# Patient Record
Sex: Female | Born: 1985 | Race: Black or African American | Hispanic: No | Marital: Single | Smoking: Current every day smoker
Health system: Southern US, Community
[De-identification: ages and names within clinical notes are randomized; demographics above are authoritative.]

## PROBLEM LIST (undated history)

## (undated) DIAGNOSIS — Z789 Other specified health status: Secondary | ICD-10-CM

## (undated) HISTORY — PX: TONSILLECTOMY: SUR1361

## (undated) HISTORY — DX: Other specified health status: Z78.9

---

## 2004-05-05 ENCOUNTER — Emergency Department: Payer: Self-pay | Admitting: Emergency Medicine

## 2004-11-12 ENCOUNTER — Observation Stay: Payer: Self-pay | Admitting: Obstetrics and Gynecology

## 2004-11-15 ENCOUNTER — Observation Stay: Payer: Self-pay | Admitting: Obstetrics and Gynecology

## 2004-11-17 ENCOUNTER — Inpatient Hospital Stay: Payer: Self-pay | Admitting: Obstetrics and Gynecology

## 2012-05-11 ENCOUNTER — Encounter: Payer: Self-pay | Admitting: Obstetrics & Gynecology

## 2013-04-09 ENCOUNTER — Other Ambulatory Visit: Payer: Self-pay | Admitting: Obstetrics and Gynecology

## 2013-04-09 ENCOUNTER — Ambulatory Visit (INDEPENDENT_AMBULATORY_CARE_PROVIDER_SITE_OTHER): Payer: 59 | Admitting: *Deleted

## 2013-04-09 ENCOUNTER — Encounter: Payer: Self-pay | Admitting: *Deleted

## 2013-04-09 VITALS — BP 122/87 | Ht 64.0 in | Wt 264.0 lb

## 2013-04-09 DIAGNOSIS — Z3480 Encounter for supervision of other normal pregnancy, unspecified trimester: Secondary | ICD-10-CM

## 2013-04-09 LAB — POCT URINE PREGNANCY: Preg Test, Ur: POSITIVE

## 2013-04-09 MED ORDER — VINATE CARE 40-1 MG PO CHEW: 1.0000 | CHEWABLE_TABLET | Freq: Every day | ORAL | Status: DC

## 2013-04-09 NOTE — Progress Notes (Signed)
P-102 

## 2013-04-09 NOTE — Patient Instructions (Signed)
Pregnancy If you are planning on getting pregnant, it is a good idea to make a preconception appointment with your caregiver to discuss having a healthy lifestyle before getting pregnant. This includes diet, weight, exercise, taking prenatal vitamins (especially folic acid, which helps prevent brain and spinal cord defects), avoiding alcohol, smoking and illegal drugs, medical problems (diabetes, convulsions), family history of genetic problems, working conditions, and immunizations. It is better to have knowledge of these things and do something about them before getting pregnant. During your pregnancy, it is important to follow certain guidelines in order to have a healthy baby. It is very important to get good prenatal care and follow your caregiver's instructions. Prenatal care includes all the medical care you receive before your baby's birth. This helps to prevent problems during the pregnancy and childbirth. HOME CARE INSTRUCTIONS   Start your prenatal visits by the 12th week of pregnancy or earlier, if possible. At first, appointments are usually scheduled monthly. They become more frequent in the last 2 months before delivery. It is important that you keep your caregiver's appointments and follow your caregiver's instructions regarding medication use, exercise, and diet.  During pregnancy, you are providing food for you and your baby. Eat a regular, well-balanced diet. Choose foods such as meat, fish, milk and other dairy products, vegetables, fruits, whole-grain breads and cereals. Your caregiver will inform you of the ideal weight gain depending on your current height and weight. Drink lots of liquids. Try to drink 8 glasses of water a day.  Alcohol is associated with a number of birth defects including fetal alcohol syndrome. It is best to avoid alcohol completely. Smoking will cause low birth rate and prematurity. Use of alcohol and nicotine during your pregnancy also increases the chances that  your child will be chemically dependent later in their life and may contribute to SIDS (Sudden Infant Death Syndrome).  Do not use illegal drugs.  Only take prescription or over-the-counter medications that are recommended by your caregiver. Other medications can cause genetic and physical problems in the baby.  Morning sickness can often be helped by keeping soda crackers at the bedside. Eat a few before getting up in the morning.  A sexual relationship may be continued until near the end of pregnancy if there are no other problems such as early (premature) leaking of amniotic fluid from the membranes, vaginal bleeding, painful intercourse or belly (abdominal) pain.  Exercise regularly. Check with your caregiver if you are unsure of the safety of some of your exercises.  Do not use hot tubs, steam rooms or saunas. These increase the risk of fainting and hurting yourself and the baby. Swimming is OK for exercise. Get plenty of rest, including afternoon naps when possible, especially in the third trimester.  Avoid toxic odors and chemicals.  Do not wear high heels. They may cause you to lose your balance and fall.  Do not lift over 5 pounds. If you do lift anything, lift with your legs and thighs, not your back.  Avoid long trips, especially in the third trimester.  If you have to travel out of the city or state, take a copy of your medical records with you. SEEK IMMEDIATE MEDICAL CARE IF:   You develop an unexplained oral temperature above 102 F (38.9 C), or as your caregiver suggests.  You have leaking of fluid from the vagina. If leaking membranes are suspected, take your temperature and inform your caregiver of this when you call.  There is vaginal spotting   or bleeding. Notify your caregiver of the amount and how many pads are used.  You continue to feel sick to your stomach (nauseous) and have no relief from remedies suggested, or you throw up (vomit) blood or coffee ground like  materials.  You develop upper abdominal pain.  You have round ligament discomfort in the lower abdominal area. This still must be evaluated by your caregiver.  You feel contractions of the uterus.  You do not feel the baby move, or there is less movement than before.  You have painful urination.  You have abnormal vaginal discharge.  You have persistent diarrhea.  You get a severe headache.  You have problems with your vision.  You develop muscle weakness.  You feel dizzy and faint.  You develop shortness of breath.  You develop chest pain.  You have back pain that travels down to your leg and feet.  You feel irregular or a very fast heartbeat.  You develop excessive weight gain in a short period of time (5 pounds in 3 to 5 days).  You are involved in a domestic violence situation. Document Released: 01/25/2005 Document Revised: 07/27/2011 Document Reviewed: 07/19/2008 [facility] Patient Information 2014 [place], [place]. Vaginal Birth After Cesarean Delivery Vaginal birth after cesarean delivery (VBAC) is giving birth vaginally after previously delivering a baby by a cesarean. In the past, if a woman had a cesarean delivery, all births afterwards would be done by cesarean delivery. This is no longer true. It can be safe for the mother to try a vaginal delivery after having a cesarean delivery.  It is important to discuss VBAC with your health care provider early in the pregnancy so you can understand the risks, benefits, and options. It will give you time to decide what is best in your particular case. The final decision about whether to have a VBAC or repeat cesarean delivery should be between you and your health care provider. Any changes in your health or your baby's health during your pregnancy may make it necessary to change your initial decision about VBAC.  WOMEN WHO PLAN TO HAVE A VBAC SHOULD CHECK WITH THEIR HEALTH CARE PROVIDER TO BE SURE THAT:  The previous cesarean  delivery was done with a low transverse uterine cut (incision) (not a vertical classical incision).   The birth canal is big enough for the baby.   There were no other operations on the uterus.   An electronic fetal monitor (EFM) will be on at all times during labor.   An operating room will be available and ready in case an emergency cesarean delivery is needed.   A health care provider and surgical nursing staff will be available at all times during labor to be ready to do an emergency delivery cesarean if necessary.   An anesthesiologist will be present in case an emergency cesarean delivery is needed.   The nursery is prepared and has adequate personnel and necessary equipment available to care for the baby in case of an emergency cesarean delivery. BENEFITS OF VBAC  Shorter stay in the hospital.   Avoidance of risks associated with cesarean delivery, such as:  Surgical complications, such as opening of the incision or hernia in the incision.  Injury to other organs.  Fever. This can occur if an infection develops after surgery. It can also occur as a reaction to the medicine given to make you numb during the surgery.  Less blood loss and need for blood transfusions.  Lower risk of blood clots  and infection.  Shorter recovery.   Decreased risk for having to remove the uterus (hysterectomy).   Decreased risk for the placenta to completely or partially cover the opening of the uterus (placenta previa) with a future pregnancy.   Decrease risk in future labor and delivery. RISKS OF A VBAC  Tearing (rupture) of the uterus. This is occurs in less than 1% of VBACs. The risk of this happening is higher if:  Steps are taken to begin the labor process (induce labor) or stimulate or strengthen contractions (augment labor).   Medicine is used to soften (ripen) the cervix.  Having to remove the uterus (hysterectomy) if it ruptures. VBAC SHOULD NOT BE DONE IF:  The  previous cesarean delivery was done with a vertical (classical) or T-shaped incision or you do not know what kind of incision was made.   You had a ruptured uterus.   You have had certain types of surgery on your uterus, such as removal of uterine fibroids. Ask your health care provider about other types of surgeries that prevent you from having a VBAC.  You have certain medical or childbirth (obstetrical) problems.   There are problems with the baby.   You have had two previous cesarean deliveries and no vaginal deliveries. OTHER FACTS TO KNOW ABOUT VBAC:  It is safe to have an epidural anesthetic with VBAC.   It is safe to turn the baby from a breech position (attempt an external cephalic version).   It is safe to try a VBAC with twins.   VBAC may not be successful if your baby weights 8.8 lb (4 kg) or more. However, weight predictions are not always accurate and should not be used alone to decide if VBAC is right for you.  There is an increased failure rate if the time between the cesarean delivery and VBAC is less than 19 months.   Your health care provider may advise against a VBAC if you have preeclampsia (high blood pressure, protein in the urine, and swelling of face and extremities).   VBAC is often successful if you previously gave birth vaginally.   VBAC is often successful when the labor starts spontaneously before the due date.   Delivering a baby through a VBAC is similar to having a normal spontaneous vaginal delivery. Document Released: 07/18/2006 Document Revised: 11/15/2012 Document Reviewed: 08/24/2012 [facility] Patient Information 2014 [place], [place].

## 2013-04-10 LAB — HIV ANTIBODY (ROUTINE TESTING W REFLEX)
HIV 1/O/2 Abs-Index Value: <1 (ref <1.00)
HIV-1/HIV-2 Ab: NONREACTIVE

## 2013-04-10 LAB — GLUCOSE TOLERANCE, 1 HOUR: GLUCOSE, 1HR PP: 111 mg/dL (ref 65–199)

## 2013-04-10 LAB — PRENATAL PROFILE I(LABCORP): HEP B S AG: NEGATIVE

## 2013-04-10 LAB — SPECIMEN STATUS REPORT

## 2013-04-13 LAB — CYSTIC FIBROSIS GENE TEST

## 2013-04-13 LAB — SPECIMEN STATUS REPORT

## 2013-04-13 LAB — CULTURE, URINE COMPREHENSIVE: Colony Count: >=100000

## 2013-04-16 ENCOUNTER — Telehealth: Payer: Self-pay | Admitting: *Deleted

## 2013-04-16 ENCOUNTER — Other Ambulatory Visit: Payer: Self-pay | Admitting: Obstetrics and Gynecology

## 2013-04-16 MED ORDER — CEPHALEXIN 500 MG PO CAPS: 500.0000 mg | ORAL_CAPSULE | Freq: Four times a day (QID) | ORAL | Status: DC

## 2013-04-16 NOTE — Telephone Encounter (Signed)
Pt aware of positive UTI.  Will go pick up medication.

## 2013-04-16 NOTE — Telephone Encounter (Signed)
Message copied by Grayland OrmondHINTON, Thadius Smisek C on Mon Apr 16, 2013  2:26 PM ------      Message from: CONSTANT, PEGGY      Created: Mon Apr 16, 2013  1:49 PM       Please inform patient of positive UTI. Keflex provided            Thanks            Peggy ------

## 2013-04-18 ENCOUNTER — Telehealth: Payer: Self-pay | Admitting: *Deleted

## 2013-04-18 NOTE — Telephone Encounter (Signed)
Left message for patient to call the office back regarding test results and antibiotics that have been called in for her.

## 2013-04-18 NOTE — Telephone Encounter (Signed)
Message copied by Barbara CowerNOGUES, Kirtan Sada L on Wed Apr 18, 2013  2:42 PM ------      Message from: CONSTANT, PEGGY      Created: Mon Apr 16, 2013  1:49 PM       Please inform patient of positive UTI. Keflex provided            Thanks            Peggy ------

## 2013-04-23 ENCOUNTER — Encounter: Payer: Self-pay | Admitting: Obstetrics & Gynecology

## 2013-04-23 ENCOUNTER — Ambulatory Visit (INDEPENDENT_AMBULATORY_CARE_PROVIDER_SITE_OTHER): Payer: 59 | Admitting: Obstetrics & Gynecology

## 2013-04-23 VITALS — BP 126/87 | Wt 266.0 lb

## 2013-04-23 DIAGNOSIS — Z3490 Encounter for supervision of normal pregnancy, unspecified, unspecified trimester: Secondary | ICD-10-CM

## 2013-04-23 DIAGNOSIS — Z3480 Encounter for supervision of other normal pregnancy, unspecified trimester: Secondary | ICD-10-CM

## 2013-04-23 DIAGNOSIS — O34219 Maternal care for unspecified type scar from previous cesarean delivery: Secondary | ICD-10-CM | POA: Insufficient documentation

## 2013-04-23 DIAGNOSIS — Z98891 History of uterine scar from previous surgery: Secondary | ICD-10-CM | POA: Insufficient documentation

## 2013-04-23 DIAGNOSIS — Z9889 Other specified postprocedural states: Secondary | ICD-10-CM

## 2013-04-23 DIAGNOSIS — J45909 Unspecified asthma, uncomplicated: Secondary | ICD-10-CM

## 2013-04-23 NOTE — Progress Notes (Signed)
P-99.  Pt thinks she may have an infection.  ?yeast infection

## 2013-04-23 NOTE — Addendum Note (Signed)
Addended by: Barbara CowerNOGUES, Ihsan Nomura L on: 04/23/2013 03:21 PM   Modules accepted: Orders

## 2013-04-23 NOTE — Progress Notes (Signed)
   Subjective:    Leslie Navarro is a G2P1001 917w5d being seen today for her first obstetrical visit.  Her obstetrical history is significant for obesity and prior c/s for failure to dilate (unsure how far dilated she was.  Induction ws for post dates and lasted 2 days). Patient does intend to breast feed. Pregnancy history fully reviewed.  Patient reports no complaints.  Filed Vitals:   04/23/13 1326  BP: 126/87  Weight: 266 lb (120.657 kg)    HISTORY: OB History  Gravida Para Term Preterm AB SAB TAB Ectopic Multiple Living  2 1 1  0 0 0 0 0 0 1    # Outcome Date GA Lbr Len/2nd Weight Sex Delivery Anes PTL Lv  2 CUR           1 TRM 11/19/04 2432w0d  8 lb 7 oz (3.827 kg) M LTCS Spinal  Y     Comments: failure to progress     History reviewed. No pertinent past medical history. Past Surgical History  Procedure Laterality Date  . Tonsillectomy     Family History  Problem Relation Age of Onset  . Diabetes Maternal Grandfather   . Cancer Paternal Grandfather 3160    colon     Exam    Uterus:     Pelvic Exam:    Perineum: No Hemorrhoids   Vulva: normal   Vagina:  normal mucosa   pH: n/a   Cervix: difficult to visulaize, small amt of bleeding with pap   Adnexa: normal adnexa   Bony Pelvis: averaaage  System: Breast:  normal appearance, no masses or tenderness   Skin: normal coloration and turgor, no rashes    Neurologic: oriented, normal mood   Extremities: no erythema, induration, or nodules   HEENT sclera clear, anicteric, oropharynx clear, no lesions, neck supple with midline trachea and thyroid without masses   Mouth/Teeth mucous membranes moist, pharynx normal without lesions and dental hygiene good   Neck supple and no masses   Cardiovascular: regular rate and rhythm   Respiratory:  chest clear, no wheezing, crepitations, rhonchi, normal symmetric air entry   Abdomen: soft, non tender, obese   Urinary: urethral meatus normal      Assessment:    Pregnancy:  G2P1001 There are no active problems to display for this patient.       Plan:     Initial labs drawn. Prenatal vitamins. Problem list reviewed and updated. Genetic Screening discussed First Screen: pt unsure of testing at this point.  Pt knows cut off is at 13 weeks 6 days.  Ultrasound discussed; fetal survey: requested.  Follow up in 4 weeks. Bedside US shows FH.  Uterus is retroverted. His prior c/s--obtaining op note  LEGGETT,KELLY H. 04/23/2013

## 2013-04-30 LAB — PAP, THINPREP RFLX HPV WITH CT/GC: PAP Smear Comment: 0

## 2013-05-21 ENCOUNTER — Ambulatory Visit (INDEPENDENT_AMBULATORY_CARE_PROVIDER_SITE_OTHER): Payer: 59 | Admitting: Family Medicine

## 2013-05-21 VITALS — BP 123/92 | Wt 266.0 lb

## 2013-05-21 DIAGNOSIS — Z3480 Encounter for supervision of other normal pregnancy, unspecified trimester: Secondary | ICD-10-CM

## 2013-05-21 DIAGNOSIS — Z3490 Encounter for supervision of normal pregnancy, unspecified, unspecified trimester: Secondary | ICD-10-CM

## 2013-05-21 DIAGNOSIS — O34219 Maternal care for unspecified type scar from previous cesarean delivery: Secondary | ICD-10-CM

## 2013-05-21 MED ORDER — MYNATE 90 PLUS PO TBCR: 1.0000 | EXTENDED_RELEASE_TABLET | Freq: Every day | ORAL | Status: DC

## 2013-05-21 MED ORDER — DOXYLAMINE SUCCINATE (SLEEP) 25 MG PO TABS: 25.0000 mg | ORAL_TABLET | Freq: Three times a day (TID) | ORAL | Status: DC | PRN

## 2013-05-21 MED ORDER — B-6 100 MG PO TABS: 1.0000 | ORAL_TABLET | Freq: Three times a day (TID) | ORAL | Status: DC | PRN

## 2013-05-21 NOTE — Patient Instructions (Signed)
Pregnancy - First Trimester During sexual intercourse, millions of sperm go into the vagina. Only 1 sperm will penetrate and fertilize the female egg while it is in the Fallopian tube. One week later, the fertilized egg implants into the wall of the uterus. An embryo begins to develop into a baby. At 6 to 8 weeks, the eyes and face are formed and the heartbeat can be seen on ultrasound. At the end of 12 weeks (first trimester), all the baby's organs are formed. Now that you are pregnant, you will want to do everything you can to have a healthy baby. Two of the most important things are to get good prenatal care and follow your caregiver's instructions. Prenatal care is all the medical care you receive before the baby's birth. It is given to prevent, find, and treat problems during the pregnancy and childbirth. PRENATAL EXAMS  During prenatal visits, your weight, blood pressure, and urine are checked. This is done to make sure you are healthy and progressing normally during the pregnancy.  A pregnant woman should gain 25 to 35 pounds during the pregnancy. However, if you are overweight or underweight, your caregiver will advise you regarding your weight.  Your caregiver will ask and answer questions for you.  Blood work, cervical cultures, other necessary tests, and a Pap test are done during your prenatal exams. These tests are done to check on your health and the probable health of your baby. Tests are strongly recommended and done for HIV with your permission. This is the virus that causes AIDS. These tests are done because medicines can be given to help prevent your baby from being born with this infection should you have been infected without knowing it. Blood work is also used to find out your blood type, previous infections, and follow your blood levels (hemoglobin).  Low hemoglobin (anemia) is common during pregnancy. Iron and vitamins are given to help prevent this. Later in the pregnancy,  blood tests for diabetes will be done along with any other tests if any problems develop.  You may need other tests to make sure you and the baby are doing well. CHANGES DURING THE FIRST TRIMESTER  Your body goes through many changes during pregnancy. They vary from person to person. Talk to your caregiver about changes you notice and are concerned about. Changes can include:  Your menstrual period stops.  The egg and sperm carry the genes that determine what you look like. Genes from you and your partner are forming a baby. The female genes determine whether the baby is a boy or a girl.  Your body increases in girth and you may feel bloated.  Feeling sick to your stomach (nauseous) and throwing up (vomiting). If the vomiting is uncontrollable, call your caregiver.  Your breasts will begin to enlarge and become tender.  Your nipples may stick out more and become darker.  The need to urinate more. Painful urination may mean you have a bladder infection.  Tiring easily.  Loss of appetite.  Cravings for certain kinds of food.  At first, you may gain or lose a couple of pounds.  You may have changes in your emotions from day to day (excited to be pregnant or concerned something may go wrong with the pregnancy and baby).  You may have more vivid and strange dreams. HOME CARE INSTRUCTIONS   It is very important to avoid all smoking, alcohol and non-prescribed drugs during your pregnancy. These affect the formation and growth of the baby.   Avoid chemicals while pregnant to ensure the delivery of a healthy infant.  Start your prenatal visits by the 12th week of pregnancy. They are usually scheduled monthly at first, then more often in the last 2 months before delivery. Keep your caregiver's appointments. Follow your caregiver's instructions regarding medicine use, blood and lab tests, exercise, and diet.  During pregnancy, you are providing food for you and your baby. Eat regular,  well-balanced meals. Choose foods such as meat, fish, milk and other low fat dairy products, vegetables, fruits, and whole-grain breads and cereals. Your caregiver will tell you of the ideal weight gain.  You can help morning sickness by keeping soda crackers at the bedside. Eat a couple before arising in the morning. You may want to use the crackers without salt on them.  Eating 4 to 5 small meals rather than 3 large meals a day also may help the nausea and vomiting.  Drinking liquids between meals instead of during meals also seems to help nausea and vomiting.  A physical sexual relationship may be continued throughout pregnancy if there are no other problems. Problems may be early (premature) leaking of amniotic fluid from the membranes, vaginal bleeding, or belly (abdominal) pain.  Exercise regularly if there are no restrictions. Check with your caregiver or physical therapist if you are unsure of the safety of some of your exercises. Greater weight gain will occur in the last 2 trimesters of pregnancy. Exercising will help:  Control your weight.  Keep you in shape.  Prepare you for labor and delivery.  Help you lose your pregnancy weight after you deliver your baby.  Wear a good support or jogging bra for breast tenderness during pregnancy. This may help if worn during sleep too.  Ask when prenatal classes are available. Begin classes when they are offered.  Do not use hot tubs, steam rooms, or saunas.  Wear your seat belt when driving. This protects you and your baby if you are in an accident.  Avoid raw meat, uncooked cheese, cat litter boxes, and soil used by cats throughout the pregnancy. These carry germs that can cause birth defects in the baby.  The first trimester is a good time to visit your dentist for your dental health. Getting your teeth cleaned is okay. Use a softer toothbrush and brush gently during pregnancy.  Ask for help if you have financial, counseling, or  nutritional needs during pregnancy. Your caregiver will be able to offer counseling for these needs as well as refer you for other special needs.  Do not take any medicines or herbs unless told by your caregiver.  Inform your caregiver if there is any mental or physical domestic violence.  Make a list of emergency phone numbers of family, friends, hospital, and police and fire departments.  Write down your questions. Take them to your prenatal visit.  Do not douche.  Do not cross your legs.  If you have to stand for long periods of time, rotate you feet or take small steps in a circle.  You may have more vaginal secretions that may require a sanitary pad. Do not use tampons or scented sanitary pads. MEDICINES AND DRUG USE IN PREGNANCY  Take prenatal vitamins as directed. The vitamin should contain 1 milligram of folic acid. Keep all vitamins out of reach of children. Only a couple vitamins or tablets containing iron may be fatal to a baby or young child when ingested.  Avoid use of all medicines, including herbs, over-the-counter medicines, not   prescribed or suggested by your caregiver. Only take over-the-counter or prescription medicines for pain, discomfort, or fever as directed by your caregiver. Do not use aspirin, ibuprofen, or naproxen unless directed by your caregiver.  Let your caregiver also know about herbs you may be using.  Alcohol is related to a number of birth defects. This includes fetal alcohol syndrome. All alcohol, in any form, should be avoided completely. Smoking will cause low birth rate and premature babies.  Street or illegal drugs are very harmful to the baby. They are absolutely forbidden. A baby born to an addicted mother will be addicted at birth. The baby will go through the same withdrawal an adult does.  Let your caregiver know about any medicines that you have to take and for what reason you take them. SEEK MEDICAL CARE IF:  You have any concerns or  worries during your pregnancy. It is better to call with your questions if you feel they cannot wait, rather than worry about them. SEEK IMMEDIATE MEDICAL CARE IF:   An unexplained oral temperature above 102 F (38.9 C) develops, or as your caregiver suggests.  You have leaking of fluid from the vagina (birth canal). If leaking membranes are suspected, take your temperature and inform your caregiver of this when you call.  There is vaginal spotting or bleeding. Notify your caregiver of the amount and how many pads are used.  You develop a bad smelling vaginal discharge with a change in the color.  You continue to feel sick to your stomach (nauseated) and have no relief from remedies suggested. You vomit blood or coffee ground-like materials.  You lose more than 2 pounds of weight in 1 week.  You gain more than 2 pounds of weight in 1 week and you notice swelling of your face, hands, feet, or legs.  You gain 5 pounds or more in 1 week (even if you do not have swelling of your hands, face, legs, or feet).  You get exposed to German measles and have never had them.  You are exposed to fifth disease or chickenpox.  You develop belly (abdominal) pain. Round ligament discomfort is a common non-cancerous (benign) cause of abdominal pain in pregnancy. Your caregiver still must evaluate this.  You develop headache, fever, diarrhea, pain with urination, or shortness of breath.  You fall or are in a car accident or have any kind of trauma.  There is mental or physical violence in your home. Document Released: 01/19/2001 Document Revised: 10/20/2011 Document Reviewed: 07/23/2008 [employer] Patient Information 2014 [employer].  Breastfeeding Deciding to breastfeed is one of the best choices you can make for you and your baby. A change in hormones during pregnancy causes your breast tissue to grow and increases the number and size of your milk ducts. These hormones also allow proteins,  sugars, and fats from your blood supply to make breast milk in your milk-producing glands. Hormones prevent breast milk from being released before your baby is born as well as prompt milk flow after birth. Once breastfeeding has begun, thoughts of your baby, as well as his or her sucking or crying, can stimulate the release of milk from your milk-producing glands.  BENEFITS OF BREASTFEEDING For Your Baby  Your first milk (colostrum) helps your baby's digestive system function better.   There are antibodies in your milk that help your baby fight off infections.   Your baby has a lower incidence of asthma, allergies, and sudden infant death syndrome.   The nutrients   in breast milk are better for your baby than infant formulas and are designed uniquely for your baby's needs.   Breast milk improves your baby's brain development.   Your baby is less likely to develop other conditions, such as childhood obesity, asthma, or type 2 diabetes mellitus.  For You   Breastfeeding helps to create a very special bond between you and your baby.   Breastfeeding is convenient. Breast milk is always available at the correct temperature and costs nothing.   Breastfeeding helps to burn calories and helps you lose the weight gained during pregnancy.   Breastfeeding makes your uterus contract to its prepregnancy size faster and slows bleeding (lochia) after you give birth.   Breastfeeding helps to lower your risk of developing type 2 diabetes mellitus, osteoporosis, and breast or ovarian cancer later in life. SIGNS THAT YOUR BABY IS HUNGRY Early Signs of Hunger  Increased alertness or activity.  Stretching.  Movement of the head from side to side.  Movement of the head and opening of the mouth when the corner of the mouth or cheek is stroked (rooting).  Increased sucking sounds, smacking lips, cooing, sighing, or squeaking.  Hand-to-mouth movements.  Increased sucking of fingers or  hands. Late Signs of Hunger  Fussing.  Intermittent crying. Extreme Signs of Hunger Signs of extreme hunger will require calming and consoling before your baby will be able to breastfeed successfully. Do not wait for the following signs of extreme hunger to occur before you initiate breastfeeding:   Restlessness.  A loud, strong cry.   Screaming. BREASTFEEDING BASICS Breastfeeding Initiation  Find a comfortable place to sit or lie down, with your neck and back well supported.  Place a pillow or rolled up blanket under your baby to bring him or her to the level of your breast (if you are seated). Nursing pillows are specially designed to help support your arms and your baby while you breastfeed.  Make sure that your baby's abdomen is facing your abdomen.   Gently massage your breast. With your fingertips, massage from your chest wall toward your nipple in a circular motion. This encourages milk flow. You may need to continue this action during the feeding if your milk flows slowly.  Support your breast with 4 fingers underneath and your thumb above your nipple. Make sure your fingers are well away from your nipple and your baby's mouth.   Stroke your baby's lips gently with your finger or nipple.   When your baby's mouth is open wide enough, quickly bring your baby to your breast, placing your entire nipple and as much of the colored area around your nipple (areola) as possible into your baby's mouth.   More areola should be visible above your baby's upper lip than below the lower lip.   Your baby's tongue should be between his or her lower gum and your breast.   Ensure that your baby's mouth is correctly positioned around your nipple (latched). Your baby's lips should create a seal on your breast and be turned out (everted).  It is common for your baby to suck about 2 3 minutes in order to start the flow of breast milk. Latching Teaching your baby how to latch on to your  breast properly is very important. An improper latch can cause nipple pain and decreased milk supply for you and poor weight gain in your baby. Also, if your baby is not latched onto your nipple properly, he or she may swallow some air during   feeding. This can make your baby fussy. Burping your baby when you switch breasts during the feeding can help to get rid of the air. However, teaching your baby to latch on properly is still the best way to prevent fussiness from swallowing air while breastfeeding. Signs that your baby has successfully latched on to your nipple:    Silent tugging or silent sucking, without causing you pain.   Swallowing heard between every 3 4 sucks.    Muscle movement above and in front of his or her ears while sucking.  Signs that your baby has not successfully latched on to nipple:   Sucking sounds or smacking sounds from your baby while breastfeeding.  Nipple pain. If you think your baby has not latched on correctly, slip your finger into the corner of your baby's mouth to break the suction and place it between your baby's gums. Attempt breastfeeding initiation again. Signs of Successful Breastfeeding Signs from your baby:   A gradual decrease in the number of sucks or complete cessation of sucking.   Falling asleep.   Relaxation of his or her body.   Retention of a small amount of milk in his or her mouth.   Letting go of your breast by himself or herself. Signs from you:  Breasts that have increased in firmness, weight, and size 1 3 hours after feeding.   Breasts that are softer immediately after breastfeeding.  Increased milk volume, as well as a change in milk consistency and color by the 5th day of breastfeeding.   Nipples that are not sore, cracked, or bleeding. Signs That Your Baby is Getting Enough Milk  Wetting at least 3 diapers in a 24-hour period. The urine should be clear and pale yellow by age 5 days.  At least 3 stools in a  24-hour period by age 5 days. The stool should be soft and yellow.  At least 3 stools in a 24-hour period by age 7 days. The stool should be seedy and yellow.  No loss of weight greater than 10% of birth weight during the first 3 days of age.  Average weight gain of 4 7 ounces (120 210 mL) per week after age 4 days.  Consistent daily weight gain by age 5 days, without weight loss after the age of 2 weeks. After a feeding, your baby may spit up a small amount. This is common. BREASTFEEDING FREQUENCY AND DURATION Frequent feeding will help you make more milk and can prevent sore nipples and breast engorgement. Breastfeed when you feel the need to reduce the fullness of your breasts or when your baby shows signs of hunger. This is called "breastfeeding on demand." Avoid introducing a pacifier to your baby while you are working to establish breastfeeding (the first 4 6 weeks after your baby is born). After this time you may choose to use a pacifier. Research has shown that pacifier use during the first year of a baby's life decreases the risk of sudden infant death syndrome (SIDS). Allow your baby to feed on each breast as long as he or she wants. Breastfeed until your baby is finished feeding. When your baby unlatches or falls asleep while feeding from the first breast, offer the second breast. Because newborns are often sleepy in the first few weeks of life, you may need to awaken your baby to get him or her to feed. Breastfeeding times will vary from baby to baby. However, the following rules can serve as a guide to help you   ensure that your baby is properly fed:  Newborns (babies 4 weeks of age or younger) may breastfeed every 1 3 hours.  Newborns should not go longer than 3 hours during the day or 5 hours during the night without breastfeeding.  You should breastfeed your baby a minimum of 8 times in a 24-hour period until you begin to introduce solid foods to your baby at around 6 months of  age. BREAST MILK PUMPING Pumping and storing breast milk allows you to ensure that your baby is exclusively fed your breast milk, even at times when you are unable to breastfeed. This is especially important if you are going back to work while you are still breastfeeding or when you are not able to be present during feedings. Your lactation consultant can give you guidelines on how long it is safe to store breast milk.  A breast pump is a machine that allows you to pump milk from your breast into a sterile bottle. The pumped breast milk can then be stored in a refrigerator or freezer. Some breast pumps are operated by hand, while others use electricity. Ask your lactation consultant which type will work best for you. Breast pumps can be purchased, but some hospitals and breastfeeding support groups lease breast pumps on a monthly basis. A lactation consultant can teach you how to hand express breast milk, if you prefer not to use a pump.  CARING FOR YOUR BREASTS WHILE YOU BREASTFEED Nipples can become dry, cracked, and sore while breastfeeding. The following recommendations can help keep your breasts moisturized and healthy:  Avoid using soap on your nipples.   Wear a supportive bra. Although not required, special nursing bras and tank tops are designed to allow access to your breasts for breastfeeding without taking off your entire bra or top. Avoid wearing underwire style bras or extremely tight bras.  Air dry your nipples for 3 4minutes after each feeding.   Use only cotton bra pads to absorb leaked breast milk. Leaking of breast milk between feedings is normal.   Use lanolin on your nipples after breastfeeding. Lanolin helps to maintain your skin's normal moisture barrier. If you use pure lanolin you do not need to wash it off before feeding your baby again. Pure lanolin is not toxic to your baby. You may also hand express a few drops of breast milk and gently massage that milk into your  nipples and allow the milk to air dry. In the first few weeks after giving birth, some women experience extremely full breasts (engorgement). Engorgement can make your breasts feel heavy, warm, and tender to the touch. Engorgement peaks within 3 5 days after you give birth. The following recommendations can help ease engorgement:  Completely empty your breasts while breastfeeding or pumping. You may want to start by applying warm, moist heat (in the shower or with warm water-soaked hand towels) just before feeding or pumping. This increases circulation and helps the milk flow. If your baby does not completely empty your breasts while breastfeeding, pump any extra milk after he or she is finished.  Wear a snug bra (nursing or regular) or tank top for 1 2 days to signal your body to slightly decrease milk production.  Apply ice packs to your breasts, unless this is too uncomfortable for you.  Make sure that your baby is latched on and positioned properly while breastfeeding. If engorgement persists after 48 hours of following these recommendations, contact your health care provider or a lactation consultant. OVERALL   HEALTH CARE RECOMMENDATIONS WHILE BREASTFEEDING  Eat healthy foods. Alternate between meals and snacks, eating 3 of each per day. Because what you eat affects your breast milk, some of the foods may make your baby more irritable than usual. Avoid eating these foods if you are sure that they are negatively affecting your baby.  Drink milk, fruit juice, and water to satisfy your thirst (about 10 glasses a day).   Rest often, relax, and continue to take your prenatal vitamins to prevent fatigue, stress, and anemia.  Continue breast self-awareness checks.  Avoid chewing and smoking tobacco.  Avoid alcohol and drug use. Some medicines that may be harmful to your baby can pass through breast milk. It is important to ask your health care provider before taking any medicine, including all  over-the-counter and prescription medicine as well as vitamin and herbal supplements. It is possible to become pregnant while breastfeeding. If birth control is desired, ask your health care provider about options that will be safe for your baby. SEEK MEDICAL CARE IF:   You feel like you want to stop breastfeeding or have become frustrated with breastfeeding.  You have painful breasts or nipples.  Your nipples are cracked or bleeding.  Your breasts are red, tender, or warm.  You have a swollen area on either breast.  You have a fever or chills.  You have nausea or vomiting.  You have drainage other than breast milk from your nipples.  Your breasts do not become full before feedings by the 5th day after you give birth.  You feel sad and depressed.  Your baby is too sleepy to eat well.  Your baby is having trouble sleeping.   Your baby is wetting less than 3 diapers in a 24-hour period.  Your baby has less than 3 stools in a 24-hour period.  Your baby's skin or the white part of his or her eyes becomes yellow.   Your baby is not gaining weight by 5 days of age. SEEK IMMEDIATE MEDICAL CARE IF:   Your baby is overly tired (lethargic) and does not want to wake up and feed.  Your baby develops an unexplained fever. Document Released: 01/25/2005 Document Revised: 09/27/2012 Document Reviewed: 07/19/2012 [employer] Patient Information 2014 [employer].  

## 2013-05-21 NOTE — Progress Notes (Signed)
Having morning sickness--trial of B6 and Unisom Declines first trimester screening or any genetic testing.

## 2013-05-21 NOTE — Progress Notes (Signed)
[id number] 

## 2013-06-18 ENCOUNTER — Encounter: Payer: Self-pay | Admitting: Obstetrics & Gynecology

## 2013-06-18 ENCOUNTER — Ambulatory Visit (INDEPENDENT_AMBULATORY_CARE_PROVIDER_SITE_OTHER): Payer: 59 | Admitting: Obstetrics & Gynecology

## 2013-06-18 VITALS — BP 119/83 | HR 100 | Wt 270.0 lb

## 2013-06-18 DIAGNOSIS — K5900 Constipation, unspecified: Secondary | ICD-10-CM

## 2013-06-18 DIAGNOSIS — Z3490 Encounter for supervision of normal pregnancy, unspecified, unspecified trimester: Secondary | ICD-10-CM

## 2013-06-18 DIAGNOSIS — Z3480 Encounter for supervision of other normal pregnancy, unspecified trimester: Secondary | ICD-10-CM

## 2013-06-18 DIAGNOSIS — O34219 Maternal care for unspecified type scar from previous cesarean delivery: Secondary | ICD-10-CM

## 2013-06-18 NOTE — Progress Notes (Signed)
Pt is having constipation issues.  Is not taking anything for the constipation.

## 2013-06-18 NOTE — Progress Notes (Signed)
Routine visit. No problems except constipation but she is not drinking much water per her (works at [employer] Declines Quad screen. Schedule anatomy u/s for 19 + weeks EGA. Rec Miralax and increase water.

## 2013-07-09 ENCOUNTER — Ambulatory Visit (HOSPITAL_COMMUNITY)
Admission: RE | Admit: 2013-07-09 | Discharge: 2013-07-09 | Disposition: A | Discharge status: Home / Self Care | Payer: 59 | Source: Ambulatory Visit | Attending: Obstetrics & Gynecology | Admitting: Obstetrics & Gynecology

## 2013-07-09 DIAGNOSIS — Z3689 Encounter for other specified antenatal screening: Secondary | ICD-10-CM | POA: Insufficient documentation

## 2013-07-09 DIAGNOSIS — Z3490 Encounter for supervision of normal pregnancy, unspecified, unspecified trimester: Secondary | ICD-10-CM

## 2013-07-16 ENCOUNTER — Encounter: Payer: Self-pay | Admitting: Family Medicine

## 2013-07-16 ENCOUNTER — Ambulatory Visit (INDEPENDENT_AMBULATORY_CARE_PROVIDER_SITE_OTHER): Payer: 59 | Admitting: Family Medicine

## 2013-07-16 VITALS — BP 115/83 | HR 104 | Wt 274.0 lb

## 2013-07-16 DIAGNOSIS — Z3480 Encounter for supervision of other normal pregnancy, unspecified trimester: Secondary | ICD-10-CM

## 2013-07-16 DIAGNOSIS — Z3490 Encounter for supervision of normal pregnancy, unspecified, unspecified trimester: Secondary | ICD-10-CM

## 2013-07-16 NOTE — Patient Instructions (Signed)
Second Trimester of Pregnancy The second trimester is from week 13 through week 28, months 4 through 6. The second trimester is often a time when you feel your best. Your body has also adjusted to being pregnant, and you begin to feel better physically. Usually, morning sickness has lessened or quit completely, you may have more energy, and you may have an increase in appetite. The second trimester is also a time when the fetus is growing rapidly. At the end of the sixth month, the fetus is about 9 inches long and weighs about 1 pounds. You will likely begin to feel the baby move (quickening) between 18 and 20 weeks of the pregnancy. BODY CHANGES Your body goes through many changes during pregnancy. The changes vary from woman to woman.   Your weight will continue to increase. You will notice your lower abdomen bulging out.  You may begin to get stretch marks on your hips, abdomen, and breasts.  You may develop headaches that can be relieved by medicines approved by your caregiver.  You may urinate more often because the fetus is pressing on your bladder.  You may develop or continue to have heartburn as a result of your pregnancy.  You may develop constipation because certain hormones are causing the muscles that push waste through your intestines to slow down.  You may develop hemorrhoids or swollen, bulging veins (varicose veins).  You may have back pain because of the weight gain and pregnancy hormones relaxing your joints between the bones in your pelvis and as a result of a shift in weight and the muscles that support your balance.  Your breasts will continue to grow and be tender.  Your gums may bleed and may be sensitive to brushing and flossing.  Dark spots or blotches (chloasma, mask of pregnancy) may develop on your face. This will likely fade after the baby is born.  A dark line from your belly button to the pubic area (linea nigra) may appear. This will likely fade after  the baby is born. WHAT TO EXPECT AT YOUR PRENATAL VISITS During a routine prenatal visit:  You will be weighed to make sure you and the fetus are growing normally.  Your blood pressure will be taken.  Your abdomen will be measured to track your baby's growth.  The fetal heartbeat will be listened to.  Any test results from the previous visit will be discussed. Your caregiver may ask you:  How you are feeling.  If you are feeling the baby move.  If you have had any abnormal symptoms, such as leaking fluid, bleeding, severe headaches, or abdominal cramping.  If you have any questions. Other tests that may be performed during your second trimester include:  Blood tests that check for:  Low iron levels (anemia).  Gestational diabetes (between 24 and 28 weeks).  Rh antibodies.  Urine tests to check for infections, diabetes, or protein in the urine.  An ultrasound to confirm the proper growth and development of the baby.  An amniocentesis to check for possible genetic problems.  Fetal screens for spina bifida and Down syndrome. HOME CARE INSTRUCTIONS   Avoid all smoking, herbs, alcohol, and unprescribed drugs. These chemicals affect the formation and growth of the baby.  Follow your caregiver's instructions regarding medicine use. There are medicines that are either safe or unsafe to take during pregnancy.  Exercise only as directed by your caregiver. Experiencing uterine cramps is a good sign to stop exercising.  Continue to eat regular,   healthy meals.  Wear a good support bra for breast tenderness.  Do not use hot tubs, steam rooms, or saunas.  Wear your seat belt at all times when driving.  Avoid raw meat, uncooked cheese, cat litter boxes, and soil used by cats. These carry germs that can cause birth defects in the baby.  Take your prenatal vitamins.  Try taking a stool softener (if your caregiver approves) if you develop constipation. Eat more high-fiber  foods, such as fresh vegetables or fruit and whole grains. Drink plenty of fluids to keep your urine clear or pale yellow.  Take warm sitz baths to soothe any pain or discomfort caused by hemorrhoids. Use hemorrhoid cream if your caregiver approves.  If you develop varicose veins, wear support hose. Elevate your feet for 15 minutes, 3 4 times a day. Limit salt in your diet.  Avoid heavy lifting, wear low heel shoes, and practice good posture.  Rest with your legs elevated if you have leg cramps or low back pain.  Visit your dentist if you have not gone yet during your pregnancy. Use a soft toothbrush to brush your teeth and be gentle when you floss.  A sexual relationship may be continued unless your caregiver directs you otherwise.  Continue to go to all your prenatal visits as directed by your caregiver. SEEK MEDICAL CARE IF:   You have dizziness.  You have mild pelvic cramps, pelvic pressure, or nagging pain in the abdominal area.  You have persistent nausea, vomiting, or diarrhea.  You have a bad smelling vaginal discharge.  You have pain with urination. SEEK IMMEDIATE MEDICAL CARE IF:   You have a fever.  You are leaking fluid from your vagina.  You have spotting or bleeding from your vagina.  You have severe abdominal cramping or pain.  You have rapid weight gain or loss.  You have shortness of breath with chest pain.  You notice sudden or extreme swelling of your face, hands, ankles, feet, or legs.  You have not felt your baby move in over an hour.  You have severe headaches that do not go away with medicine.  You have vision changes. Document Released: 01/19/2001 Document Revised: 09/27/2012 Document Reviewed: 03/28/2012 [employer] Patient Information 2014 [employer].  Breastfeeding Deciding to breastfeed is one of the best choices you can make for you and your baby. A change in hormones during pregnancy causes your breast tissue to grow and increases the  number and size of your milk ducts. These hormones also allow proteins, sugars, and fats from your blood supply to make breast milk in your milk-producing glands. Hormones prevent breast milk from being released before your baby is born as well as prompt milk flow after birth. Once breastfeeding has begun, thoughts of your baby, as well as his or her sucking or crying, can stimulate the release of milk from your milk-producing glands.  BENEFITS OF BREASTFEEDING For Your Baby  Your first milk (colostrum) helps your baby's digestive system function better.   There are antibodies in your milk that help your baby fight off infections.   Your baby has a lower incidence of asthma, allergies, and sudden infant death syndrome.   The nutrients in breast milk are better for your baby than infant formulas and are designed uniquely for your baby's needs.   Breast milk improves your baby's brain development.   Your baby is less likely to develop other conditions, such as childhood obesity, asthma, or type 2 diabetes mellitus.  For   You   Breastfeeding helps to create a very special bond between you and your baby.   Breastfeeding is convenient. Breast milk is always available at the correct temperature and costs nothing.   Breastfeeding helps to burn calories and helps you lose the weight gained during pregnancy.   Breastfeeding makes your uterus contract to its prepregnancy size faster and slows bleeding (lochia) after you give birth.   Breastfeeding helps to lower your risk of developing type 2 diabetes mellitus, osteoporosis, and breast or ovarian cancer later in life. SIGNS THAT YOUR BABY IS HUNGRY Early Signs of Hunger  Increased alertness or activity.  Stretching.  Movement of the head from side to side.  Movement of the head and opening of the mouth when the corner of the mouth or cheek is stroked (rooting).  Increased sucking sounds, smacking lips, cooing, sighing, or  squeaking.  Hand-to-mouth movements.  Increased sucking of fingers or hands. Late Signs of Hunger  Fussing.  Intermittent crying. Extreme Signs of Hunger Signs of extreme hunger will require calming and consoling before your baby will be able to breastfeed successfully. Do not wait for the following signs of extreme hunger to occur before you initiate breastfeeding:   Restlessness.  A loud, strong cry.   Screaming. BREASTFEEDING BASICS Breastfeeding Initiation  Find a comfortable place to sit or lie down, with your neck and back well supported.  Place a pillow or rolled up blanket under your baby to bring him or her to the level of your breast (if you are seated). Nursing pillows are specially designed to help support your arms and your baby while you breastfeed.  Make sure that your baby's abdomen is facing your abdomen.   Gently massage your breast. With your fingertips, massage from your chest wall toward your nipple in a circular motion. This encourages milk flow. You may need to continue this action during the feeding if your milk flows slowly.  Support your breast with 4 fingers underneath and your thumb above your nipple. Make sure your fingers are well away from your nipple and your baby's mouth.   Stroke your baby's lips gently with your finger or nipple.   When your baby's mouth is open wide enough, quickly bring your baby to your breast, placing your entire nipple and as much of the colored area around your nipple (areola) as possible into your baby's mouth.   More areola should be visible above your baby's upper lip than below the lower lip.   Your baby's tongue should be between his or her lower gum and your breast.   Ensure that your baby's mouth is correctly positioned around your nipple (latched). Your baby's lips should create a seal on your breast and be turned out (everted).  It is common for your baby to suck about 2 3 minutes in order to start the  flow of breast milk. Latching Teaching your baby how to latch on to your breast properly is very important. An improper latch can cause nipple pain and decreased milk supply for you and poor weight gain in your baby. Also, if your baby is not latched onto your nipple properly, he or she may swallow some air during feeding. This can make your baby fussy. Burping your baby when you switch breasts during the feeding can help to get rid of the air. However, teaching your baby to latch on properly is still the best way to prevent fussiness from swallowing air while breastfeeding. Signs that your baby has   successfully latched on to your nipple:    Silent tugging or silent sucking, without causing you pain.   Swallowing heard between every 3 4 sucks.    Muscle movement above and in front of his or her ears while sucking.  Signs that your baby has not successfully latched on to nipple:   Sucking sounds or smacking sounds from your baby while breastfeeding.  Nipple pain. If you think your baby has not latched on correctly, slip your finger into the corner of your baby's mouth to break the suction and place it between your baby's gums. Attempt breastfeeding initiation again. Signs of Successful Breastfeeding Signs from your baby:   A gradual decrease in the number of sucks or complete cessation of sucking.   Falling asleep.   Relaxation of his or her body.   Retention of a small amount of milk in his or her mouth.   Letting go of your breast by himself or herself. Signs from you:  Breasts that have increased in firmness, weight, and size 1 3 hours after feeding.   Breasts that are softer immediately after breastfeeding.  Increased milk volume, as well as a change in milk consistency and color by the 5th day of breastfeeding.   Nipples that are not sore, cracked, or bleeding. Signs That Your Baby is Getting Enough Milk  Wetting at least 3 diapers in a 24-hour period. The urine  should be clear and pale yellow by age 5 days.  At least 3 stools in a 24-hour period by age 5 days. The stool should be soft and yellow.  At least 3 stools in a 24-hour period by age 7 days. The stool should be seedy and yellow.  No loss of weight greater than 10% of birth weight during the first 3 days of age.  Average weight gain of 4 7 ounces (120 210 mL) per week after age 4 days.  Consistent daily weight gain by age 5 days, without weight loss after the age of 2 weeks. After a feeding, your baby may spit up a small amount. This is common. BREASTFEEDING FREQUENCY AND DURATION Frequent feeding will help you make more milk and can prevent sore nipples and breast engorgement. Breastfeed when you feel the need to reduce the fullness of your breasts or when your baby shows signs of hunger. This is called "breastfeeding on demand." Avoid introducing a pacifier to your baby while you are working to establish breastfeeding (the first 4 6 weeks after your baby is born). After this time you may choose to use a pacifier. Research has shown that pacifier use during the first year of a baby's life decreases the risk of sudden infant death syndrome (SIDS). Allow your baby to feed on each breast as long as he or she wants. Breastfeed until your baby is finished feeding. When your baby unlatches or falls asleep while feeding from the first breast, offer the second breast. Because newborns are often sleepy in the first few weeks of life, you may need to awaken your baby to get him or her to feed. Breastfeeding times will vary from baby to baby. However, the following rules can serve as a guide to help you ensure that your baby is properly fed:  Newborns (babies 4 weeks of age or younger) may breastfeed every 1 3 hours.  Newborns should not go longer than 3 hours during the day or 5 hours during the night without breastfeeding.  You should breastfeed your baby a minimum of   8 times in a 24-hour period until  you begin to introduce solid foods to your baby at around 6 months of age. BREAST MILK PUMPING Pumping and storing breast milk allows you to ensure that your baby is exclusively fed your breast milk, even at times when you are unable to breastfeed. This is especially important if you are going back to work while you are still breastfeeding or when you are not able to be present during feedings. Your lactation consultant can give you guidelines on how long it is safe to store breast milk.  A breast pump is a machine that allows you to pump milk from your breast into a sterile bottle. The pumped breast milk can then be stored in a refrigerator or freezer. Some breast pumps are operated by hand, while others use electricity. Ask your lactation consultant which type will work best for you. Breast pumps can be purchased, but some hospitals and breastfeeding support groups lease breast pumps on a monthly basis. A lactation consultant can teach you how to hand express breast milk, if you prefer not to use a pump.  CARING FOR YOUR BREASTS WHILE YOU BREASTFEED Nipples can become dry, cracked, and sore while breastfeeding. The following recommendations can help keep your breasts moisturized and healthy:  Avoid using soap on your nipples.   Wear a supportive bra. Although not required, special nursing bras and tank tops are designed to allow access to your breasts for breastfeeding without taking off your entire bra or top. Avoid wearing underwire style bras or extremely tight bras.  Air dry your nipples for 3 4minutes after each feeding.   Use only cotton bra pads to absorb leaked breast milk. Leaking of breast milk between feedings is normal.   Use lanolin on your nipples after breastfeeding. Lanolin helps to maintain your skin's normal moisture barrier. If you use pure lanolin you do not need to wash it off before feeding your baby again. Pure lanolin is not toxic to your baby. You may also hand express a  few drops of breast milk and gently massage that milk into your nipples and allow the milk to air dry. In the first few weeks after giving birth, some women experience extremely full breasts (engorgement). Engorgement can make your breasts feel heavy, warm, and tender to the touch. Engorgement peaks within 3 5 days after you give birth. The following recommendations can help ease engorgement:  Completely empty your breasts while breastfeeding or pumping. You may want to start by applying warm, moist heat (in the shower or with warm water-soaked hand towels) just before feeding or pumping. This increases circulation and helps the milk flow. If your baby does not completely empty your breasts while breastfeeding, pump any extra milk after he or she is finished.  Wear a snug bra (nursing or regular) or tank top for 1 2 days to signal your body to slightly decrease milk production.  Apply ice packs to your breasts, unless this is too uncomfortable for you.  Make sure that your baby is latched on and positioned properly while breastfeeding. If engorgement persists after 48 hours of following these recommendations, contact your health care provider or a lactation consultant. OVERALL HEALTH CARE RECOMMENDATIONS WHILE BREASTFEEDING  Eat healthy foods. Alternate between meals and snacks, eating 3 of each per day. Because what you eat affects your breast milk, some of the foods may make your baby more irritable than usual. Avoid eating these foods if you are sure that they are   negatively affecting your baby.  Drink milk, fruit juice, and water to satisfy your thirst (about 10 glasses a day).   Rest often, relax, and continue to take your prenatal vitamins to prevent fatigue, stress, and anemia.  Continue breast self-awareness checks.  Avoid chewing and smoking tobacco.  Avoid alcohol and drug use. Some medicines that may be harmful to your baby can pass through breast milk. It is important to ask your  health care provider before taking any medicine, including all over-the-counter and prescription medicine as well as vitamin and herbal supplements. It is possible to become pregnant while breastfeeding. If birth control is desired, ask your health care provider about options that will be safe for your baby. SEEK MEDICAL CARE IF:   You feel like you want to stop breastfeeding or have become frustrated with breastfeeding.  You have painful breasts or nipples.  Your nipples are cracked or bleeding.  Your breasts are red, tender, or warm.  You have a swollen area on either breast.  You have a fever or chills.  You have nausea or vomiting.  You have drainage other than breast milk from your nipples.  Your breasts do not become full before feedings by the 5th day after you give birth.  You feel sad and depressed.  Your baby is too sleepy to eat well.  Your baby is having trouble sleeping.   Your baby is wetting less than 3 diapers in a 24-hour period.  Your baby has less than 3 stools in a 24-hour period.  Your baby's skin or the white part of his or her eyes becomes yellow.   Your baby is not gaining weight by 5 days of age. SEEK IMMEDIATE MEDICAL CARE IF:   Your baby is overly tired (lethargic) and does not want to wake up and feed.  Your baby develops an unexplained fever. Document Released: 01/25/2005 Document Revised: 09/27/2012 Document Reviewed: 07/19/2012 [employer] Patient Information 2014 [employer].  

## 2013-07-16 NOTE — Progress Notes (Signed)
Reviewed anatomy US--needs f/u in 6 wks--scheduled

## 2013-07-30 ENCOUNTER — Encounter (HOSPITAL_COMMUNITY): Payer: Self-pay

## 2013-08-13 ENCOUNTER — Ambulatory Visit (INDEPENDENT_AMBULATORY_CARE_PROVIDER_SITE_OTHER): Payer: 59 | Admitting: Obstetrics & Gynecology

## 2013-08-13 VITALS — BP 122/94 | HR 107 | Wt 279.0 lb

## 2013-08-13 DIAGNOSIS — Z3492 Encounter for supervision of normal pregnancy, unspecified, second trimester: Secondary | ICD-10-CM

## 2013-08-13 DIAGNOSIS — Z3480 Encounter for supervision of other normal pregnancy, unspecified trimester: Secondary | ICD-10-CM

## 2013-08-13 NOTE — Patient Instructions (Addendum)
Return to clinic for any obstetric concerns or go to [facility] for evaluation Tetanus, Diphtheria, Pertussis (Tdap) Vaccine What You Need to Know WHY GET VACCINATED? Tetanus, diphtheria and pertussis can be very serious diseases, even for adolescents and adults. Tdap vaccine can protect us from these diseases. TETANUS (Lockjaw) causes painful muscle tightening and stiffness, usually all over the body.  It can lead to tightening of muscles in the head and neck so you can't open your mouth, swallow, or sometimes even breathe. Tetanus kills about 1 out of 5 people who are infected. DIPHTHERIA can cause a thick coating to form in the back of the throat.  It can lead to breathing problems, paralysis, heart failure, and death. PERTUSSIS (Whooping Cough) causes severe coughing spells, which can cause difficulty breathing, vomiting and disturbed sleep.  It can also lead to weight loss, incontinence, and rib fractures. Up to 2 in 100 adolescents and 5 in 100 adults with pertussis are hospitalized or have complications, which could include pneumonia and death. These diseases are caused by bacteria. Diphtheria and pertussis are spread from person to person through coughing or sneezing. Tetanus enters the body through cuts, scratches, or wounds. Before vaccines, the [place] saw as many as 200,000 cases a year of diphtheria and pertussis, and hundreds of cases of tetanus. Since vaccination began, tetanus and diphtheria have dropped by about 99% and pertussis by about 80%. TDAP VACCINE Tdap vaccine can protect adolescents and adults from tetanus, diphtheria, and pertussis. One dose of Tdap is routinely given at age 11 or 12. People who did not get Tdap at that age should get it as soon as possible. Tdap is especially important for health care professionals and anyone having close contact with a baby younger than 12 months. Pregnant women should get a dose of Tdap during every pregnancy, to protect the newborn  from pertussis. Infants are most at risk for severe, life-threatening complications from pertussis. A similar vaccine, called Td, protects from tetanus and diphtheria, but not pertussis. A Td booster should be given every 10 years. Tdap may be given as one of these boosters if you have not already gotten a dose. Tdap may also be given after a severe cut or burn to prevent tetanus infection. Your doctor can give you more information. Tdap may safely be given at the same time as other vaccines. SOME PEOPLE SHOULD NOT GET THIS VACCINE  If you ever had a life-threatening allergic reaction after a dose of any tetanus, diphtheria, or pertussis containing vaccine, OR if you have a severe allergy to any part of this vaccine, you should not get Tdap. Tell your doctor if you have any severe allergies.  If you had a coma, or long or multiple seizures within 7 days after a childhood dose of DTP or DTaP, you should not get Tdap, unless a cause other than the vaccine was found. You can still get Td.  Talk to your doctor if you:  have epilepsy or another nervous system problem,  had severe pain or swelling after any vaccine containing diphtheria, tetanus or pertussis,  ever had Guillain-Barr Syndrome (GBS),  aren't feeling well on the day the shot is scheduled. RISKS OF A VACCINE REACTION With any medicine, including vaccines, there is a chance of side effects. These are usually mild and go away on their own, but serious reactions are also possible. Brief fainting spells can follow a vaccination, leading to injuries from falling. Sitting or lying down for about 15 minutes can   help prevent these. Tell your doctor if you feel dizzy or light-headed, or have vision changes or ringing in the ears. Mild problems following Tdap (Did not interfere with activities)  Pain where the shot was given (about 3 in 4 adolescents or 2 in 3 adults)  Redness or swelling where the shot was given (about 1 person in 5)  Mild  fever of at least 100.4F (up to about 1 in 25 adolescents or 1 in 100 adults)  Headache (about 3 or 4 people in 10)  Tiredness (about 1 person in 3 or 4)  Nausea, vomiting, diarrhea, stomach ache (up to 1 in 4 adolescents or 1 in 10 adults)  Chills, body aches, sore joints, rash, swollen glands (uncommon) Moderate problems following Tdap (Interfered with activities, but did not require medical attention)  Pain where the shot was given (about 1 in 5 adolescents or 1 in 100 adults)  Redness or swelling where the shot was given (up to about 1 in 16 adolescents or 1 in 25 adults)  Fever over 102F (about 1 in 100 adolescents or 1 in 250 adults)  Headache (about 3 in 20 adolescents or 1 in 10 adults)  Nausea, vomiting, diarrhea, stomach ache (up to 1 or 3 people in 100)  Swelling of the entire arm where the shot was given (up to about 3 in 100). Severe problems following Tdap (Unable to perform usual activities, required medical attention)  Swelling, severe pain, bleeding and redness in the arm where the shot was given (rare). A severe allergic reaction could occur after any vaccine (estimated less than 1 in a million doses). WHAT IF THERE IS A SERIOUS REACTION? What should I look for?  Look for anything that concerns you, such as signs of a severe allergic reaction, very high fever, or behavior changes. Signs of a severe allergic reaction can include hives, swelling of the face and throat, difficulty breathing, a fast heartbeat, dizziness, and weakness. These would start a few minutes to a few hours after the vaccination. What should I do?  If you think it is a severe allergic reaction or other emergency that can't wait, call 9-1-1 or get the person to the nearest hospital. Otherwise, call your doctor.  Afterward, the reaction should be reported to the "Vaccine Adverse Event Reporting System" (VAERS). Your doctor might file this report, or you can do it yourself through the VAERS web  site at www.vaers.hhs.gov, or by calling 1-800-822-7967. VAERS is only for reporting reactions. They do not give medical advice.  THE NATIONAL VACCINE INJURY COMPENSATION PROGRAM The National Vaccine Injury Compensation Program (VICP) is a federal program that was created to compensate people who may have been injured by certain vaccines. Persons who believe they may have been injured by a vaccine can learn about the program and about filing a claim by calling 1-800-338-2382 or visiting the VICP website at www.hrsa.gov/vaccinecompensation. HOW CAN I LEARN MORE?  Ask your doctor.  Call your local or state health department.  Contact the Centers for Disease Control and Prevention (CDC):  Call 1-800-232-4636 or visit CDC's website at www.cdc.gov/vaccines. CDC Tdap Vaccine VIS (06/17/11) Document Released: 07/27/2011 Document Revised: 05/22/2012 Document Reviewed: 05/17/2012 [employer] Patient Information 2015 [employer]. This information is not intended to replace advice given to you by your health care provider. Make sure you discuss any questions you have with your health care provider.  

## 2013-08-13 NOTE — Progress Notes (Signed)
SBP 94, no other signs/symptoms of preeclampsia. Will continue to monitor. TOLAC and BTL papers signed today; patient only wants wants BTL in the event of a RCS. No other complaints or concerns.  Fetal movement and labor precautions reviewed

## 2013-08-21 ENCOUNTER — Encounter (HOSPITAL_COMMUNITY): Payer: Self-pay

## 2013-08-21 ENCOUNTER — Ambulatory Visit (HOSPITAL_COMMUNITY)
Admission: RE | Admit: 2013-08-21 | Discharge: 2013-08-21 | Disposition: A | Discharge status: Home / Self Care | Payer: 59 | Source: Ambulatory Visit | Attending: Family Medicine | Admitting: Family Medicine

## 2013-08-21 DIAGNOSIS — Z3689 Encounter for other specified antenatal screening: Secondary | ICD-10-CM | POA: Insufficient documentation

## 2013-08-21 DIAGNOSIS — Z3490 Encounter for supervision of normal pregnancy, unspecified, unspecified trimester: Secondary | ICD-10-CM

## 2013-08-22 ENCOUNTER — Encounter: Payer: Self-pay | Admitting: Family Medicine

## 2013-09-10 ENCOUNTER — Ambulatory Visit (INDEPENDENT_AMBULATORY_CARE_PROVIDER_SITE_OTHER): Payer: 59 | Admitting: Family Medicine

## 2013-09-10 VITALS — BP 121/81 | HR 103 | Wt 280.0 lb

## 2013-09-10 DIAGNOSIS — Z23 Encounter for immunization: Secondary | ICD-10-CM

## 2013-09-10 DIAGNOSIS — Z3480 Encounter for supervision of other normal pregnancy, unspecified trimester: Secondary | ICD-10-CM

## 2013-09-10 DIAGNOSIS — Z3493 Encounter for supervision of normal pregnancy, unspecified, third trimester: Secondary | ICD-10-CM

## 2013-09-10 LAB — OB RESULTS CONSOLE HIV ANTIBODY (ROUTINE TESTING): HIV: NONREACTIVE

## 2013-09-10 LAB — OB RESULTS CONSOLE RPR: RPR: NONREACTIVE

## 2013-09-10 MED ORDER — TETANUS-DIPHTH-ACELL PERTUSSIS 5-2.5-18.5 LF-MCG/0.5 IM SUSP
0.5000 mL | Freq: Once | INTRAMUSCULAR | Status: AC
  Administered 2013-09-10: 0.5 mL via INTRAMUSCULAR

## 2013-09-10 NOTE — Progress Notes (Signed)
Glucose test today

## 2013-09-10 NOTE — Patient Instructions (Signed)
Third Trimester of Pregnancy The third trimester is from week 29 through week 42, months 7 through 9. The third trimester is a time when the fetus is growing rapidly. At the end of the ninth month, the fetus is about 20 inches in length and weighs 6-10 pounds.  BODY CHANGES Your body goes through many changes during pregnancy. The changes vary from woman to woman.   Your weight will continue to increase. You can expect to gain 25-35 pounds (11-16 kg) by the end of the pregnancy.  You may begin to get stretch marks on your hips, abdomen, and breasts.  You may urinate more often because the fetus is moving lower into your pelvis and pressing on your bladder.  You may develop or continue to have heartburn as a result of your pregnancy.  You may develop constipation because certain hormones are causing the muscles that push waste through your intestines to slow down.  You may develop hemorrhoids or swollen, bulging veins (varicose veins).  You may have pelvic pain because of the weight gain and pregnancy hormones relaxing your joints between the bones in your pelvis. Backaches may result from overexertion of the muscles supporting your posture.  You may have changes in your hair. These can include thickening of your hair, rapid growth, and changes in texture. Some women also have hair loss during or after pregnancy, or hair that feels dry or thin. Your hair will most likely return to normal after your baby is born.  Your breasts will continue to grow and be tender. A yellow discharge may leak from your breasts called colostrum.  Your belly button may stick out.  You may feel short of breath because of your expanding uterus.  You may notice the fetus "dropping," or moving lower in your abdomen.  You may have a bloody mucus discharge. This usually occurs a few days to a week before labor begins.  Your cervix becomes thin and soft (effaced) near your due date. WHAT TO EXPECT AT YOUR  PRENATAL EXAMS  You will have prenatal exams every 2 weeks until week 36. Then, you will have weekly prenatal exams. During a routine prenatal visit:  You will be weighed to make sure you and the fetus are growing normally.  Your blood pressure is taken.  Your abdomen will be measured to track your baby's growth.  The fetal heartbeat will be listened to.  Any test results from the previous visit will be discussed.  You may have a cervical check near your due date to see if you have effaced. At around 36 weeks, your caregiver will check your cervix. At the same time, your caregiver will also perform a test on the secretions of the vaginal tissue. This test is to determine if a type of bacteria, Group B streptococcus, is present. Your caregiver will explain this further. Your caregiver may ask you:  What your birth plan is.  How you are feeling.  If you are feeling the baby move.  If you have had any abnormal symptoms, such as leaking fluid, bleeding, severe headaches, or abdominal cramping.  If you have any questions. Other tests or screenings that may be performed during your third trimester include:  Blood tests that check for low iron levels (anemia).  Fetal testing to check the health, activity level, and growth of the fetus. Testing is done if you have certain medical conditions or if there are problems during the pregnancy. FALSE LABOR You may feel small, irregular contractions that   eventually go away. These are called Braxton Hicks contractions, or false labor. Contractions may last for hours, days, or even weeks before true labor sets in. If contractions come at regular intervals, intensify, or become painful, it is best to be seen by your caregiver.  SIGNS OF LABOR   Menstrual-like cramps.  Contractions that are 5 minutes apart or less.  Contractions that start on the top of the uterus and spread down to the lower abdomen and back.  A sense of increased pelvic  pressure or back pain.  A watery or bloody mucus discharge that comes from the vagina. If you have any of these signs before the 37th week of pregnancy, call your caregiver right away. You need to go to the hospital to get checked immediately. HOME CARE INSTRUCTIONS   Avoid all smoking, herbs, alcohol, and unprescribed drugs. These chemicals affect the formation and growth of the baby.  Follow your caregiver's instructions regarding medicine use. There are medicines that are either safe or unsafe to take during pregnancy.  Exercise only as directed by your caregiver. Experiencing uterine cramps is a good sign to stop exercising.  Continue to eat regular, healthy meals.  Wear a good support bra for breast tenderness.  Do not use hot tubs, steam rooms, or saunas.  Wear your seat belt at all times when driving.  Avoid raw meat, uncooked cheese, cat litter boxes, and soil used by cats. These carry germs that can cause birth defects in the baby.  Take your prenatal vitamins.  Try taking a stool softener (if your caregiver approves) if you develop constipation. Eat more high-fiber foods, such as fresh vegetables or fruit and whole grains. Drink plenty of fluids to keep your urine clear or pale yellow.  Take warm sitz baths to soothe any pain or discomfort caused by hemorrhoids. Use hemorrhoid cream if your caregiver approves.  If you develop varicose veins, wear support hose. Elevate your feet for 15 minutes, 3-4 times a day. Limit salt in your diet.  Avoid heavy lifting, wear low heal shoes, and practice good posture.  Rest a lot with your legs elevated if you have leg cramps or low back pain.  Visit your dentist if you have not gone during your pregnancy. Use a soft toothbrush to brush your teeth and be gentle when you floss.  A sexual relationship may be continued unless your caregiver directs you otherwise.  Do not travel far distances unless it is absolutely necessary and only  with the approval of your caregiver.  Take prenatal classes to understand, practice, and ask questions about the labor and delivery.  Make a trial run to the hospital.  Pack your hospital bag.  Prepare the baby's nursery.  Continue to go to all your prenatal visits as directed by your caregiver. SEEK MEDICAL CARE IF:  You are unsure if you are in labor or if your water has broken.  You have dizziness.  You have mild pelvic cramps, pelvic pressure, or nagging pain in your abdominal area.  You have persistent nausea, vomiting, or diarrhea.  You have a bad smelling vaginal discharge.  You have pain with urination. SEEK IMMEDIATE MEDICAL CARE IF:   You have a fever.  You are leaking fluid from your vagina.  You have spotting or bleeding from your vagina.  You have severe abdominal cramping or pain.  You have rapid weight loss or gain.  You have shortness of breath with chest pain.  You notice sudden or extreme swelling   of your face, hands, ankles, feet, or legs.  You have not felt your baby move in over an hour.  You have severe headaches that do not go away with medicine.  You have vision changes. Document Released: 01/19/2001 Document Revised: 01/30/2013 Document Reviewed: 03/28/2012 [employer] Patient Information 2015 [employer]. This information is not intended to replace advice given to you by your health care provider. Make sure you discuss any questions you have with your health care provider.  Breastfeeding Deciding to breastfeed is one of the best choices you can make for you and your baby. A change in hormones during pregnancy causes your breast tissue to grow and increases the number and size of your milk ducts. These hormones also allow proteins, sugars, and fats from your blood supply to make breast milk in your milk-producing glands. Hormones prevent breast milk from being released before your baby is born as well as prompt milk flow after birth. Once  breastfeeding has begun, thoughts of your baby, as well as his or her sucking or crying, can stimulate the release of milk from your milk-producing glands.  BENEFITS OF BREASTFEEDING For Your Baby  Your first milk (colostrum) helps your baby's digestive system function better.   There are antibodies in your milk that help your baby fight off infections.   Your baby has a lower incidence of asthma, allergies, and sudden infant death syndrome.   The nutrients in breast milk are better for your baby than infant formulas and are designed uniquely for your baby's needs.   Breast milk improves your baby's brain development.   Your baby is less likely to develop other conditions, such as childhood obesity, asthma, or type 2 diabetes mellitus.  For You   Breastfeeding helps to create a very special bond between you and your baby.   Breastfeeding is convenient. Breast milk is always available at the correct temperature and costs nothing.   Breastfeeding helps to burn calories and helps you lose the weight gained during pregnancy.   Breastfeeding makes your uterus contract to its prepregnancy size faster and slows bleeding (lochia) after you give birth.   Breastfeeding helps to lower your risk of developing type 2 diabetes mellitus, osteoporosis, and breast or ovarian cancer later in life. SIGNS THAT YOUR BABY IS HUNGRY Early Signs of Hunger  Increased alertness or activity.  Stretching.  Movement of the head from side to side.  Movement of the head and opening of the mouth when the corner of the mouth or cheek is stroked (rooting).  Increased sucking sounds, smacking lips, cooing, sighing, or squeaking.  Hand-to-mouth movements.  Increased sucking of fingers or hands. Late Signs of Hunger  Fussing.  Intermittent crying. Extreme Signs of Hunger Signs of extreme hunger will require calming and consoling before your baby will be able to breastfeed successfully. Do not  wait for the following signs of extreme hunger to occur before you initiate breastfeeding:   Restlessness.  A loud, strong cry.   Screaming. BREASTFEEDING BASICS Breastfeeding Initiation  Find a comfortable place to sit or lie down, with your neck and back well supported.  Place a pillow or rolled up blanket under your baby to bring him or her to the level of your breast (if you are seated). Nursing pillows are specially designed to help support your arms and your baby while you breastfeed.  Make sure that your baby's abdomen is facing your abdomen.   Gently massage your breast. With your fingertips, massage from your chest   wall toward your nipple in a circular motion. This encourages milk flow. You may need to continue this action during the feeding if your milk flows slowly.  Support your breast with 4 fingers underneath and your thumb above your nipple. Make sure your fingers are well away from your nipple and your baby's mouth.   Stroke your baby's lips gently with your finger or nipple.   When your baby's mouth is open wide enough, quickly bring your baby to your breast, placing your entire nipple and as much of the colored area around your nipple (areola) as possible into your baby's mouth.   More areola should be visible above your baby's upper lip than below the lower lip.   Your baby's tongue should be between his or her lower gum and your breast.   Ensure that your baby's mouth is correctly positioned around your nipple (latched). Your baby's lips should create a seal on your breast and be turned out (everted).  It is common for your baby to suck about 2-3 minutes in order to start the flow of breast milk. Latching Teaching your baby how to latch on to your breast properly is very important. An improper latch can cause nipple pain and decreased milk supply for you and poor weight gain in your baby. Also, if your baby is not latched onto your nipple properly, he or she  may swallow some air during feeding. This can make your baby fussy. Burping your baby when you switch breasts during the feeding can help to get rid of the air. However, teaching your baby to latch on properly is still the best way to prevent fussiness from swallowing air while breastfeeding. Signs that your baby has successfully latched on to your nipple:    Silent tugging or silent sucking, without causing you pain.   Swallowing heard between every 3-4 sucks.    Muscle movement above and in front of his or her ears while sucking.  Signs that your baby has not successfully latched on to nipple:   Sucking sounds or smacking sounds from your baby while breastfeeding.  Nipple pain. If you think your baby has not latched on correctly, slip your finger into the corner of your baby's mouth to break the suction and place it between your baby's gums. Attempt breastfeeding initiation again. Signs of Successful Breastfeeding Signs from your baby:   A gradual decrease in the number of sucks or complete cessation of sucking.   Falling asleep.   Relaxation of his or her body.   Retention of a small amount of milk in his or her mouth.   Letting go of your breast by himself or herself. Signs from you:  Breasts that have increased in firmness, weight, and size 1-3 hours after feeding.   Breasts that are softer immediately after breastfeeding.  Increased milk volume, as well as a change in milk consistency and color by the fifth day of breastfeeding.   Nipples that are not sore, cracked, or bleeding. Signs That Your Baby is Getting Enough Milk  Wetting at least 3 diapers in a 24-hour period. The urine should be clear and pale yellow by age 5 days.  At least 3 stools in a 24-hour period by age 5 days. The stool should be soft and yellow.  At least 3 stools in a 24-hour period by age 7 days. The stool should be seedy and yellow.  No loss of weight greater than 10% of birth weight  during the first 3   days of age.  Average weight gain of 4-7 ounces (113-198 g) per week after age 4 days.  Consistent daily weight gain by age 5 days, without weight loss after the age of 2 weeks. After a feeding, your baby may spit up a small amount. This is common. BREASTFEEDING FREQUENCY AND DURATION Frequent feeding will help you make more milk and can prevent sore nipples and breast engorgement. Breastfeed when you feel the need to reduce the fullness of your breasts or when your baby shows signs of hunger. This is called "breastfeeding on demand." Avoid introducing a pacifier to your baby while you are working to establish breastfeeding (the first 4-6 weeks after your baby is born). After this time you may choose to use a pacifier. Research has shown that pacifier use during the first year of a baby's life decreases the risk of sudden infant death syndrome (SIDS). Allow your baby to feed on each breast as long as he or she wants. Breastfeed until your baby is finished feeding. When your baby unlatches or falls asleep while feeding from the first breast, offer the second breast. Because newborns are often sleepy in the first few weeks of life, you may need to awaken your baby to get him or her to feed. Breastfeeding times will vary from baby to baby. However, the following rules can serve as a guide to help you ensure that your baby is properly fed:  Newborns (babies 4 weeks of age or younger) may breastfeed every 1-3 hours.  Newborns should not go longer than 3 hours during the day or 5 hours during the night without breastfeeding.  You should breastfeed your baby a minimum of 8 times in a 24-hour period until you begin to introduce solid foods to your baby at around 6 months of age. BREAST MILK PUMPING Pumping and storing breast milk allows you to ensure that your baby is exclusively fed your breast milk, even at times when you are unable to breastfeed. This is especially important if you are  going back to work while you are still breastfeeding or when you are not able to be present during feedings. Your lactation consultant can give you guidelines on how long it is safe to store breast milk.  A breast pump is a machine that allows you to pump milk from your breast into a sterile bottle. The pumped breast milk can then be stored in a refrigerator or freezer. Some breast pumps are operated by hand, while others use electricity. Ask your lactation consultant which type will work best for you. Breast pumps can be purchased, but some hospitals and breastfeeding support groups lease breast pumps on a monthly basis. A lactation consultant can teach you how to hand express breast milk, if you prefer not to use a pump.  CARING FOR YOUR BREASTS WHILE YOU BREASTFEED Nipples can become dry, cracked, and sore while breastfeeding. The following recommendations can help keep your breasts moisturized and healthy:  Avoid using soap on your nipples.   Wear a supportive bra. Although not required, special nursing bras and tank tops are designed to allow access to your breasts for breastfeeding without taking off your entire bra or top. Avoid wearing underwire-style bras or extremely tight bras.  Air dry your nipples for 3-4minutes after each feeding.   Use only cotton bra pads to absorb leaked breast milk. Leaking of breast milk between feedings is normal.   Use lanolin on your nipples after breastfeeding. Lanolin helps to maintain your skin's   normal moisture barrier. If you use pure lanolin, you do not need to wash it off before feeding your baby again. Pure lanolin is not toxic to your baby. You may also hand express a few drops of breast milk and gently massage that milk into your nipples and allow the milk to air dry. In the first few weeks after giving birth, some women experience extremely full breasts (engorgement). Engorgement can make your breasts feel heavy, warm, and tender to the touch.  Engorgement peaks within 3-5 days after you give birth. The following recommendations can help ease engorgement:  Completely empty your breasts while breastfeeding or pumping. You may want to start by applying warm, moist heat (in the shower or with warm water-soaked hand towels) just before feeding or pumping. This increases circulation and helps the milk flow. If your baby does not completely empty your breasts while breastfeeding, pump any extra milk after he or she is finished.  Wear a snug bra (nursing or regular) or tank top for 1-2 days to signal your body to slightly decrease milk production.  Apply ice packs to your breasts, unless this is too uncomfortable for you.  Make sure that your baby is latched on and positioned properly while breastfeeding. If engorgement persists after 48 hours of following these recommendations, contact your health care provider or a lactation consultant. OVERALL HEALTH CARE RECOMMENDATIONS WHILE BREASTFEEDING  Eat healthy foods. Alternate between meals and snacks, eating 3 of each per day. Because what you eat affects your breast milk, some of the foods may make your baby more irritable than usual. Avoid eating these foods if you are sure that they are negatively affecting your baby.  Drink milk, fruit juice, and water to satisfy your thirst (about 10 glasses a day).   Rest often, relax, and continue to take your prenatal vitamins to prevent fatigue, stress, and anemia.  Continue breast self-awareness checks.  Avoid chewing and smoking tobacco.  Avoid alcohol and drug use. Some medicines that may be harmful to your baby can pass through breast milk. It is important to ask your health care provider before taking any medicine, including all over-the-counter and prescription medicine as well as vitamin and herbal supplements. It is possible to become pregnant while breastfeeding. If birth control is desired, ask your health care provider about options that  will be safe for your baby. SEEK MEDICAL CARE IF:   You feel like you want to stop breastfeeding or have become frustrated with breastfeeding.  You have painful breasts or nipples.  Your nipples are cracked or bleeding.  Your breasts are red, tender, or warm.  You have a swollen area on either breast.  You have a fever or chills.  You have nausea or vomiting.  You have drainage other than breast milk from your nipples.  Your breasts do not become full before feedings by the fifth day after you give birth.  You feel sad and depressed.  Your baby is too sleepy to eat well.  Your baby is having trouble sleeping.   Your baby is wetting less than 3 diapers in a 24-hour period.  Your baby has less than 3 stools in a 24-hour period.  Your baby's skin or the white part of his or her eyes becomes yellow.   Your baby is not gaining weight by 5 days of age. SEEK IMMEDIATE MEDICAL CARE IF:   Your baby is overly tired (lethargic) and does not want to wake up and feed.  Your baby   develops an unexplained fever. Document Released: 01/25/2005 Document Revised: 01/30/2013 Document Reviewed: 07/19/2012 [employer] Patient Information 2015 [employer]. This information is not intended to replace advice given to you by your health care provider. Make sure you discuss any questions you have with your health care provider.  

## 2013-09-10 NOTE — Progress Notes (Signed)
28 wk labs and TDaP today BP ok today

## 2013-09-11 LAB — CBC
HEMATOCRIT: 32.1 % — AB (ref 34.0–46.6)
Hemoglobin: 10.6 g/dL — ABNORMAL LOW (ref 11.1–15.9)
MCH: 23.8 pg — ABNORMAL LOW (ref 26.6–33.0)
MCHC: 33 g/dL (ref 31.5–35.7)
MCV: 72 fL — AB (ref 79–97)
PLATELETS: 163 10*3/uL (ref 150–379)
RBC: 4.45 x10E6/uL (ref 3.77–5.28)
RDW: 14.5 % (ref 12.3–15.4)
WBC: 8.8 10*3/uL (ref 3.4–10.8)

## 2013-09-11 LAB — RPR: SYPHILIS RPR SCR: NONREACTIVE

## 2013-09-11 LAB — HIV ANTIBODY (ROUTINE TESTING W REFLEX)
HIV 1/HIV 2 AB: NONREACTIVE
HIV 1/O/2 Abs-Index Value: <1 (ref <1.00)

## 2013-09-11 LAB — GLUCOSE, 1 HOUR GESTATIONAL: Gestational Diabetes Screen: 141 mg/dL — ABNORMAL HIGH (ref 65–139)

## 2013-09-12 ENCOUNTER — Observation Stay: Payer: Self-pay | Admitting: Emergency Medicine

## 2013-09-17 ENCOUNTER — Other Ambulatory Visit: Payer: 59

## 2013-09-24 ENCOUNTER — Other Ambulatory Visit (INDEPENDENT_AMBULATORY_CARE_PROVIDER_SITE_OTHER): Payer: 59 | Admitting: *Deleted

## 2013-09-24 DIAGNOSIS — R7309 Other abnormal glucose: Secondary | ICD-10-CM

## 2013-09-24 DIAGNOSIS — O99810 Abnormal glucose complicating pregnancy: Secondary | ICD-10-CM

## 2013-09-25 LAB — GLUCOSE TOLERANCE (3 SP BLOOD)
GLUCOSE FASTING GTT: 68 mg/dL (ref 65–99)
GLUCOSE, 2 HOUR: 124 mg/dL (ref 65–139)
GLUCOSE, 3 HOUR: 73 mg/dL (ref 65–109)
Glucose, 1 Hour GTT: 165 mg/dL (ref 65–199)

## 2013-09-26 ENCOUNTER — Encounter: Payer: Self-pay | Admitting: Family Medicine

## 2013-10-01 ENCOUNTER — Ambulatory Visit (INDEPENDENT_AMBULATORY_CARE_PROVIDER_SITE_OTHER): Payer: 59 | Admitting: Obstetrics & Gynecology

## 2013-10-01 VITALS — BP 103/81 | HR 99 | Wt 283.0 lb

## 2013-10-01 DIAGNOSIS — Z0000 Encounter for general adult medical examination without abnormal findings: Secondary | ICD-10-CM

## 2013-10-01 DIAGNOSIS — Z3480 Encounter for supervision of other normal pregnancy, unspecified trimester: Secondary | ICD-10-CM

## 2013-10-01 DIAGNOSIS — Z3493 Encounter for supervision of normal pregnancy, unspecified, third trimester: Secondary | ICD-10-CM

## 2013-10-01 DIAGNOSIS — O34219 Maternal care for unspecified type scar from previous cesarean delivery: Secondary | ICD-10-CM

## 2013-10-01 NOTE — Progress Notes (Signed)
Normal 3 hour GTT. No other complaints or concerns.  Labor and fetal movement precautions reviewed. Work labs checked today as per patient request.

## 2013-10-03 LAB — HEMOGLOBIN A1C
Est. average glucose Bld gHb Est-mCnc: 120 mg/dL
Hgb A1c MFr Bld: 5.8 % — ABNORMAL HIGH (ref 4.8–5.6)

## 2013-10-03 LAB — LIPID PANEL
CHOLESTEROL TOTAL: 208 mg/dL — AB (ref 100–199)
Chol/HDL Ratio: 3.5 ratio units (ref 0.0–4.4)
HDL: 60 mg/dL (ref >39)
LDL Calculated: 128 mg/dL — ABNORMAL HIGH (ref 0–99)
Triglycerides: 100 mg/dL (ref 0–149)
VLDL Cholesterol Cal: 20 mg/dL (ref 5–40)

## 2013-10-04 LAB — GLUCOSE, RANDOM: GLUCOSE, PLASMA: 81 mg/dL (ref 65–99)

## 2013-10-09 LAB — NICOTINE/COTININE METABOLITES
COTININE: NOT DETECTED ng/mL
Nicotine: NOT DETECTED ng/mL

## 2013-10-12 ENCOUNTER — Other Ambulatory Visit: Payer: Self-pay | Admitting: Obstetrics & Gynecology

## 2013-10-12 DIAGNOSIS — O34219 Maternal care for unspecified type scar from previous cesarean delivery: Secondary | ICD-10-CM

## 2013-10-16 ENCOUNTER — Ambulatory Visit (HOSPITAL_COMMUNITY)
Admission: RE | Admit: 2013-10-16 | Discharge: 2013-10-16 | Disposition: A | Discharge status: Home / Self Care | Payer: 59 | Source: Ambulatory Visit | Attending: Obstetrics & Gynecology | Admitting: Obstetrics & Gynecology

## 2013-10-16 ENCOUNTER — Encounter: Payer: 59 | Admitting: Obstetrics & Gynecology

## 2013-10-16 VITALS — BP 135/67 | Wt 285.8 lb

## 2013-10-16 DIAGNOSIS — O99210 Obesity complicating pregnancy, unspecified trimester: Secondary | ICD-10-CM | POA: Diagnosis not present

## 2013-10-16 DIAGNOSIS — Z3689 Encounter for other specified antenatal screening: Secondary | ICD-10-CM | POA: Insufficient documentation

## 2013-10-16 DIAGNOSIS — Z3493 Encounter for supervision of normal pregnancy, unspecified, third trimester: Secondary | ICD-10-CM

## 2013-10-16 DIAGNOSIS — E669 Obesity, unspecified: Secondary | ICD-10-CM | POA: Insufficient documentation

## 2013-10-16 DIAGNOSIS — O34219 Maternal care for unspecified type scar from previous cesarean delivery: Secondary | ICD-10-CM | POA: Diagnosis present

## 2013-10-19 ENCOUNTER — Ambulatory Visit (INDEPENDENT_AMBULATORY_CARE_PROVIDER_SITE_OTHER): Payer: 59 | Admitting: Obstetrics & Gynecology

## 2013-10-19 ENCOUNTER — Encounter: Payer: Self-pay | Admitting: Obstetrics & Gynecology

## 2013-10-19 VITALS — BP 118/82 | HR 112 | Wt 284.8 lb

## 2013-10-19 DIAGNOSIS — Z3493 Encounter for supervision of normal pregnancy, unspecified, third trimester: Secondary | ICD-10-CM

## 2013-10-19 DIAGNOSIS — O34219 Maternal care for unspecified type scar from previous cesarean delivery: Secondary | ICD-10-CM

## 2013-10-19 DIAGNOSIS — Z23 Encounter for immunization: Secondary | ICD-10-CM

## 2013-10-19 DIAGNOSIS — Z3480 Encounter for supervision of other normal pregnancy, unspecified trimester: Secondary | ICD-10-CM

## 2013-10-19 NOTE — Patient Instructions (Signed)
Return to clinic for any obstetric concerns or go to [facility] for evaluation  

## 2013-10-19 NOTE — Progress Notes (Signed)
10/16/13 EFW 5 lbs 1 oz 77%, AC 895, normal AFV.  Scheduled for rescan in early 11/2013. No other complaints or concerns.  Flu vaccine given today.  Labor and fetal movement precautions reviewed.

## 2013-11-05 ENCOUNTER — Encounter: Payer: 59 | Admitting: Obstetrics & Gynecology

## 2013-11-12 ENCOUNTER — Encounter: Payer: Self-pay | Admitting: Family Medicine

## 2013-11-12 ENCOUNTER — Ambulatory Visit (INDEPENDENT_AMBULATORY_CARE_PROVIDER_SITE_OTHER): Payer: 59 | Admitting: Family Medicine

## 2013-11-12 VITALS — BP 123/85 | HR 106 | Wt 286.0 lb

## 2013-11-12 DIAGNOSIS — Z3493 Encounter for supervision of normal pregnancy, unspecified, third trimester: Secondary | ICD-10-CM

## 2013-11-12 DIAGNOSIS — Z36 Encounter for antenatal screening of mother: Secondary | ICD-10-CM

## 2013-11-12 LAB — OB RESULTS CONSOLE GC/CHLAMYDIA
Chlamydia: NEGATIVE
Gonorrhea: NEGATIVE

## 2013-11-12 NOTE — Patient Instructions (Signed)
Third Trimester of Pregnancy The third trimester is from week 29 through week 42, months 7 through 9. The third trimester is a time when the fetus is growing rapidly. At the end of the ninth month, the fetus is about 20 inches in length and weighs 6-10 pounds.  BODY CHANGES Your body goes through many changes during pregnancy. The changes vary from woman to woman.   Your weight will continue to increase. You can expect to gain 25-35 pounds (11-16 kg) by the end of the pregnancy.  You may begin to get stretch marks on your hips, abdomen, and breasts.  You may urinate more often because the fetus is moving lower into your pelvis and pressing on your bladder.  You may develop or continue to have heartburn as a result of your pregnancy.  You may develop constipation because certain hormones are causing the muscles that push waste through your intestines to slow down.  You may develop hemorrhoids or swollen, bulging veins (varicose veins).  You may have pelvic pain because of the weight gain and pregnancy hormones relaxing your joints between the bones in your pelvis. Backaches may result from overexertion of the muscles supporting your posture.  You may have changes in your hair. These can include thickening of your hair, rapid growth, and changes in texture. Some women also have hair loss during or after pregnancy, or hair that feels dry or thin. Your hair will most likely return to normal after your baby is born.  Your breasts will continue to grow and be tender. A yellow discharge may leak from your breasts called colostrum.  Your belly button may stick out.  You may feel short of breath because of your expanding uterus.  You may notice the fetus "dropping," or moving lower in your abdomen.  You may have a bloody mucus discharge. This usually occurs a few days to a week before labor begins.  Your cervix becomes thin and soft (effaced) near your due date. WHAT TO EXPECT AT YOUR  PRENATAL EXAMS  You will have prenatal exams every 2 weeks until week 36. Then, you will have weekly prenatal exams. During a routine prenatal visit:  You will be weighed to make sure you and the fetus are growing normally.  Your blood pressure is taken.  Your abdomen will be measured to track your baby's growth.  The fetal heartbeat will be listened to.  Any test results from the previous visit will be discussed.  You may have a cervical check near your due date to see if you have effaced. At around 36 weeks, your caregiver will check your cervix. At the same time, your caregiver will also perform a test on the secretions of the vaginal tissue. This test is to determine if a type of bacteria, Group B streptococcus, is present. Your caregiver will explain this further. Your caregiver may ask you:  What your birth plan is.  How you are feeling.  If you are feeling the baby move.  If you have had any abnormal symptoms, such as leaking fluid, bleeding, severe headaches, or abdominal cramping.  If you have any questions. Other tests or screenings that may be performed during your third trimester include:  Blood tests that check for low iron levels (anemia).  Fetal testing to check the health, activity level, and growth of the fetus. Testing is done if you have certain medical conditions or if there are problems during the pregnancy. FALSE LABOR You may feel small, irregular contractions that   eventually go away. These are called Braxton Hicks contractions, or false labor. Contractions may last for hours, days, or even weeks before true labor sets in. If contractions come at regular intervals, intensify, or become painful, it is best to be seen by your caregiver.  SIGNS OF LABOR   Menstrual-like cramps.  Contractions that are 5 minutes apart or less.  Contractions that start on the top of the uterus and spread down to the lower abdomen and back.  A sense of increased pelvic  pressure or back pain.  A watery or bloody mucus discharge that comes from the vagina. If you have any of these signs before the 37th week of pregnancy, call your caregiver right away. You need to go to the hospital to get checked immediately. HOME CARE INSTRUCTIONS   Avoid all smoking, herbs, alcohol, and unprescribed drugs. These chemicals affect the formation and growth of the baby.  Follow your caregiver's instructions regarding medicine use. There are medicines that are either safe or unsafe to take during pregnancy.  Exercise only as directed by your caregiver. Experiencing uterine cramps is a good sign to stop exercising.  Continue to eat regular, healthy meals.  Wear a good support bra for breast tenderness.  Do not use hot tubs, steam rooms, or saunas.  Wear your seat belt at all times when driving.  Avoid raw meat, uncooked cheese, cat litter boxes, and soil used by cats. These carry germs that can cause birth defects in the baby.  Take your prenatal vitamins.  Try taking a stool softener (if your caregiver approves) if you develop constipation. Eat more high-fiber foods, such as fresh vegetables or fruit and whole grains. Drink plenty of fluids to keep your urine clear or pale yellow.  Take warm sitz baths to soothe any pain or discomfort caused by hemorrhoids. Use hemorrhoid cream if your caregiver approves.  If you develop varicose veins, wear support hose. Elevate your feet for 15 minutes, 3-4 times a day. Limit salt in your diet.  Avoid heavy lifting, wear low heal shoes, and practice good posture.  Rest a lot with your legs elevated if you have leg cramps or low back pain.  Visit your dentist if you have not gone during your pregnancy. Use a soft toothbrush to brush your teeth and be gentle when you floss.  A sexual relationship may be continued unless your caregiver directs you otherwise.  Do not travel far distances unless it is absolutely necessary and only  with the approval of your caregiver.  Take prenatal classes to understand, practice, and ask questions about the labor and delivery.  Make a trial run to the hospital.  Pack your hospital bag.  Prepare the baby's nursery.  Continue to go to all your prenatal visits as directed by your caregiver. SEEK MEDICAL CARE IF:  You are unsure if you are in labor or if your water has broken.  You have dizziness.  You have mild pelvic cramps, pelvic pressure, or nagging pain in your abdominal area.  You have persistent nausea, vomiting, or diarrhea.  You have a bad smelling vaginal discharge.  You have pain with urination. SEEK IMMEDIATE MEDICAL CARE IF:   You have a fever.  You are leaking fluid from your vagina.  You have spotting or bleeding from your vagina.  You have severe abdominal cramping or pain.  You have rapid weight loss or gain.  You have shortness of breath with chest pain.  You notice sudden or extreme swelling   of your face, hands, ankles, feet, or legs.  You have not felt your baby move in over an hour.  You have severe headaches that do not go away with medicine.  You have vision changes. Document Released: 01/19/2001 Document Revised: 01/30/2013 Document Reviewed: 03/28/2012 [employer] Patient Information 2015 [employer]. This information is not intended to replace advice given to you by your health care provider. Make sure you discuss any questions you have with your health care provider.  Breastfeeding Deciding to breastfeed is one of the best choices you can make for you and your baby. A change in hormones during pregnancy causes your breast tissue to grow and increases the number and size of your milk ducts. These hormones also allow proteins, sugars, and fats from your blood supply to make breast milk in your milk-producing glands. Hormones prevent breast milk from being released before your baby is born as well as prompt milk flow after birth. Once  breastfeeding has begun, thoughts of your baby, as well as his or her sucking or crying, can stimulate the release of milk from your milk-producing glands.  BENEFITS OF BREASTFEEDING For Your Baby  Your first milk (colostrum) helps your baby's digestive system function better.   There are antibodies in your milk that help your baby fight off infections.   Your baby has a lower incidence of asthma, allergies, and sudden infant death syndrome.   The nutrients in breast milk are better for your baby than infant formulas and are designed uniquely for your baby's needs.   Breast milk improves your baby's brain development.   Your baby is less likely to develop other conditions, such as childhood obesity, asthma, or type 2 diabetes mellitus.  For You   Breastfeeding helps to create a very special bond between you and your baby.   Breastfeeding is convenient. Breast milk is always available at the correct temperature and costs nothing.   Breastfeeding helps to burn calories and helps you lose the weight gained during pregnancy.   Breastfeeding makes your uterus contract to its prepregnancy size faster and slows bleeding (lochia) after you give birth.   Breastfeeding helps to lower your risk of developing type 2 diabetes mellitus, osteoporosis, and breast or ovarian cancer later in life. SIGNS THAT YOUR BABY IS HUNGRY Early Signs of Hunger  Increased alertness or activity.  Stretching.  Movement of the head from side to side.  Movement of the head and opening of the mouth when the corner of the mouth or cheek is stroked (rooting).  Increased sucking sounds, smacking lips, cooing, sighing, or squeaking.  Hand-to-mouth movements.  Increased sucking of fingers or hands. Late Signs of Hunger  Fussing.  Intermittent crying. Extreme Signs of Hunger Signs of extreme hunger will require calming and consoling before your baby will be able to breastfeed successfully. Do not  wait for the following signs of extreme hunger to occur before you initiate breastfeeding:   Restlessness.  A loud, strong cry.   Screaming. BREASTFEEDING BASICS Breastfeeding Initiation  Find a comfortable place to sit or lie down, with your neck and back well supported.  Place a pillow or rolled up blanket under your baby to bring him or her to the level of your breast (if you are seated). Nursing pillows are specially designed to help support your arms and your baby while you breastfeed.  Make sure that your baby's abdomen is facing your abdomen.   Gently massage your breast. With your fingertips, massage from your chest   wall toward your nipple in a circular motion. This encourages milk flow. You may need to continue this action during the feeding if your milk flows slowly.  Support your breast with 4 fingers underneath and your thumb above your nipple. Make sure your fingers are well away from your nipple and your baby's mouth.   Stroke your baby's lips gently with your finger or nipple.   When your baby's mouth is open wide enough, quickly bring your baby to your breast, placing your entire nipple and as much of the colored area around your nipple (areola) as possible into your baby's mouth.   More areola should be visible above your baby's upper lip than below the lower lip.   Your baby's tongue should be between his or her lower gum and your breast.   Ensure that your baby's mouth is correctly positioned around your nipple (latched). Your baby's lips should create a seal on your breast and be turned out (everted).  It is common for your baby to suck about 2-3 minutes in order to start the flow of breast milk. Latching Teaching your baby how to latch on to your breast properly is very important. An improper latch can cause nipple pain and decreased milk supply for you and poor weight gain in your baby. Also, if your baby is not latched onto your nipple properly, he or she  may swallow some air during feeding. This can make your baby fussy. Burping your baby when you switch breasts during the feeding can help to get rid of the air. However, teaching your baby to latch on properly is still the best way to prevent fussiness from swallowing air while breastfeeding. Signs that your baby has successfully latched on to your nipple:    Silent tugging or silent sucking, without causing you pain.   Swallowing heard between every 3-4 sucks.    Muscle movement above and in front of his or her ears while sucking.  Signs that your baby has not successfully latched on to nipple:   Sucking sounds or smacking sounds from your baby while breastfeeding.  Nipple pain. If you think your baby has not latched on correctly, slip your finger into the corner of your baby's mouth to break the suction and place it between your baby's gums. Attempt breastfeeding initiation again. Signs of Successful Breastfeeding Signs from your baby:   A gradual decrease in the number of sucks or complete cessation of sucking.   Falling asleep.   Relaxation of his or her body.   Retention of a small amount of milk in his or her mouth.   Letting go of your breast by himself or herself. Signs from you:  Breasts that have increased in firmness, weight, and size 1-3 hours after feeding.   Breasts that are softer immediately after breastfeeding.  Increased milk volume, as well as a change in milk consistency and color by the fifth day of breastfeeding.   Nipples that are not sore, cracked, or bleeding. Signs That Your Baby is Getting Enough Milk  Wetting at least 3 diapers in a 24-hour period. The urine should be clear and pale yellow by age 5 days.  At least 3 stools in a 24-hour period by age 5 days. The stool should be soft and yellow.  At least 3 stools in a 24-hour period by age 7 days. The stool should be seedy and yellow.  No loss of weight greater than 10% of birth weight  during the first 3   days of age.  Average weight gain of 4-7 ounces (113-198 g) per week after age 4 days.  Consistent daily weight gain by age 5 days, without weight loss after the age of 2 weeks. After a feeding, your baby may spit up a small amount. This is common. BREASTFEEDING FREQUENCY AND DURATION Frequent feeding will help you make more milk and can prevent sore nipples and breast engorgement. Breastfeed when you feel the need to reduce the fullness of your breasts or when your baby shows signs of hunger. This is called "breastfeeding on demand." Avoid introducing a pacifier to your baby while you are working to establish breastfeeding (the first 4-6 weeks after your baby is born). After this time you may choose to use a pacifier. Research has shown that pacifier use during the first year of a baby's life decreases the risk of sudden infant death syndrome (SIDS). Allow your baby to feed on each breast as long as he or she wants. Breastfeed until your baby is finished feeding. When your baby unlatches or falls asleep while feeding from the first breast, offer the second breast. Because newborns are often sleepy in the first few weeks of life, you may need to awaken your baby to get him or her to feed. Breastfeeding times will vary from baby to baby. However, the following rules can serve as a guide to help you ensure that your baby is properly fed:  Newborns (babies 4 weeks of age or younger) may breastfeed every 1-3 hours.  Newborns should not go longer than 3 hours during the day or 5 hours during the night without breastfeeding.  You should breastfeed your baby a minimum of 8 times in a 24-hour period until you begin to introduce solid foods to your baby at around 6 months of age. BREAST MILK PUMPING Pumping and storing breast milk allows you to ensure that your baby is exclusively fed your breast milk, even at times when you are unable to breastfeed. This is especially important if you are  going back to work while you are still breastfeeding or when you are not able to be present during feedings. Your lactation consultant can give you guidelines on how long it is safe to store breast milk.  A breast pump is a machine that allows you to pump milk from your breast into a sterile bottle. The pumped breast milk can then be stored in a refrigerator or freezer. Some breast pumps are operated by hand, while others use electricity. Ask your lactation consultant which type will work best for you. Breast pumps can be purchased, but some hospitals and breastfeeding support groups lease breast pumps on a monthly basis. A lactation consultant can teach you how to hand express breast milk, if you prefer not to use a pump.  CARING FOR YOUR BREASTS WHILE YOU BREASTFEED Nipples can become dry, cracked, and sore while breastfeeding. The following recommendations can help keep your breasts moisturized and healthy:  Avoid using soap on your nipples.   Wear a supportive bra. Although not required, special nursing bras and tank tops are designed to allow access to your breasts for breastfeeding without taking off your entire bra or top. Avoid wearing underwire-style bras or extremely tight bras.  Air dry your nipples for 3-4minutes after each feeding.   Use only cotton bra pads to absorb leaked breast milk. Leaking of breast milk between feedings is normal.   Use lanolin on your nipples after breastfeeding. Lanolin helps to maintain your skin's   normal moisture barrier. If you use pure lanolin, you do not need to wash it off before feeding your baby again. Pure lanolin is not toxic to your baby. You may also hand express a few drops of breast milk and gently massage that milk into your nipples and allow the milk to air dry. In the first few weeks after giving birth, some women experience extremely full breasts (engorgement). Engorgement can make your breasts feel heavy, warm, and tender to the touch.  Engorgement peaks within 3-5 days after you give birth. The following recommendations can help ease engorgement:  Completely empty your breasts while breastfeeding or pumping. You may want to start by applying warm, moist heat (in the shower or with warm water-soaked hand towels) just before feeding or pumping. This increases circulation and helps the milk flow. If your baby does not completely empty your breasts while breastfeeding, pump any extra milk after he or she is finished.  Wear a snug bra (nursing or regular) or tank top for 1-2 days to signal your body to slightly decrease milk production.  Apply ice packs to your breasts, unless this is too uncomfortable for you.  Make sure that your baby is latched on and positioned properly while breastfeeding. If engorgement persists after 48 hours of following these recommendations, contact your health care provider or a lactation consultant. OVERALL HEALTH CARE RECOMMENDATIONS WHILE BREASTFEEDING  Eat healthy foods. Alternate between meals and snacks, eating 3 of each per day. Because what you eat affects your breast milk, some of the foods may make your baby more irritable than usual. Avoid eating these foods if you are sure that they are negatively affecting your baby.  Drink milk, fruit juice, and water to satisfy your thirst (about 10 glasses a day).   Rest often, relax, and continue to take your prenatal vitamins to prevent fatigue, stress, and anemia.  Continue breast self-awareness checks.  Avoid chewing and smoking tobacco.  Avoid alcohol and drug use. Some medicines that may be harmful to your baby can pass through breast milk. It is important to ask your health care provider before taking any medicine, including all over-the-counter and prescription medicine as well as vitamin and herbal supplements. It is possible to become pregnant while breastfeeding. If birth control is desired, ask your health care provider about options that  will be safe for your baby. SEEK MEDICAL CARE IF:   You feel like you want to stop breastfeeding or have become frustrated with breastfeeding.  You have painful breasts or nipples.  Your nipples are cracked or bleeding.  Your breasts are red, tender, or warm.  You have a swollen area on either breast.  You have a fever or chills.  You have nausea or vomiting.  You have drainage other than breast milk from your nipples.  Your breasts do not become full before feedings by the fifth day after you give birth.  You feel sad and depressed.  Your baby is too sleepy to eat well.  Your baby is having trouble sleeping.   Your baby is wetting less than 3 diapers in a 24-hour period.  Your baby has less than 3 stools in a 24-hour period.  Your baby's skin or the white part of his or her eyes becomes yellow.   Your baby is not gaining weight by 5 days of age. SEEK IMMEDIATE MEDICAL CARE IF:   Your baby is overly tired (lethargic) and does not want to wake up and feed.  Your baby   develops an unexplained fever. Document Released: 01/25/2005 Document Revised: 01/30/2013 Document Reviewed: 07/19/2012 [employer] Patient Information 2015 [employer]. This information is not intended to replace advice given to you by your health care provider. Make sure you discuss any questions you have with your health care provider.  

## 2013-11-12 NOTE — Progress Notes (Signed)
Last labs today.

## 2013-11-12 NOTE — Progress Notes (Signed)
Cultures today Labor precautions 

## 2013-11-13 LAB — CULTURE, BETA STREP (GROUP B ONLY)

## 2013-11-13 LAB — GC/CHLAMYDIA PROBE AMP
CT PROBE, AMP APTIMA: NEGATIVE
GC Probe RNA: NEGATIVE

## 2013-11-14 ENCOUNTER — Encounter: Payer: Self-pay | Admitting: Family Medicine

## 2013-11-14 ENCOUNTER — Ambulatory Visit (HOSPITAL_COMMUNITY): Payer: 59

## 2013-11-14 DIAGNOSIS — O99820 Streptococcus B carrier state complicating pregnancy: Secondary | ICD-10-CM | POA: Insufficient documentation

## 2013-11-15 LAB — OB RESULTS CONSOLE GBS: GBS: POSITIVE

## 2013-11-16 ENCOUNTER — Other Ambulatory Visit: Payer: Self-pay | Admitting: Obstetrics & Gynecology

## 2013-11-16 DIAGNOSIS — Z3A37 37 weeks gestation of pregnancy: Secondary | ICD-10-CM

## 2013-11-16 DIAGNOSIS — O34219 Maternal care for unspecified type scar from previous cesarean delivery: Secondary | ICD-10-CM

## 2013-11-16 DIAGNOSIS — O99213 Obesity complicating pregnancy, third trimester: Secondary | ICD-10-CM

## 2013-11-19 ENCOUNTER — Ambulatory Visit (INDEPENDENT_AMBULATORY_CARE_PROVIDER_SITE_OTHER): Payer: 59 | Admitting: Obstetrics & Gynecology

## 2013-11-19 VITALS — BP 144/88 | HR 100 | Wt 288.0 lb

## 2013-11-19 DIAGNOSIS — O163 Unspecified maternal hypertension, third trimester: Secondary | ICD-10-CM

## 2013-11-19 DIAGNOSIS — Z3493 Encounter for supervision of normal pregnancy, unspecified, third trimester: Secondary | ICD-10-CM

## 2013-11-19 DIAGNOSIS — O133 Gestational [pregnancy-induced] hypertension without significant proteinuria, third trimester: Secondary | ICD-10-CM

## 2013-11-19 NOTE — Patient Instructions (Signed)
Return to clinic for any obstetric concerns or go to [facility] for evaluation  

## 2013-11-19 NOTE — Progress Notes (Signed)
GBS positive, patient aware of need to treat in labor.   Elevated BP today. Denies HA visual changes, abdominal pain. Preeclampsia precautions reviewed. Labs to be checked today. Growth scan scheduled for tomorrow, will recheck BP then.  If still elevated, will meet criteria for GHTN (>140/90) and will need to be induced. No other complaints or concerns.  Labor and fetal movement precautions reviewed.

## 2013-11-20 ENCOUNTER — Ambulatory Visit (HOSPITAL_COMMUNITY)
Admission: RE | Admit: 2013-11-20 | Discharge: 2013-11-20 | Disposition: A | Discharge status: Home / Self Care | Payer: 59 | Source: Ambulatory Visit | Attending: Obstetrics & Gynecology | Admitting: Obstetrics & Gynecology

## 2013-11-20 DIAGNOSIS — O34219 Maternal care for unspecified type scar from previous cesarean delivery: Secondary | ICD-10-CM

## 2013-11-20 DIAGNOSIS — O99213 Obesity complicating pregnancy, third trimester: Secondary | ICD-10-CM | POA: Diagnosis not present

## 2013-11-20 DIAGNOSIS — O3421 Maternal care for scar from previous cesarean delivery: Secondary | ICD-10-CM | POA: Insufficient documentation

## 2013-11-20 DIAGNOSIS — Z3A37 37 weeks gestation of pregnancy: Secondary | ICD-10-CM | POA: Diagnosis not present

## 2013-11-21 ENCOUNTER — Encounter: Payer: 59 | Admitting: Advanced Practice Midwife

## 2013-11-21 ENCOUNTER — Ambulatory Visit (INDEPENDENT_AMBULATORY_CARE_PROVIDER_SITE_OTHER): Payer: 59 | Admitting: Family Medicine

## 2013-11-21 VITALS — BP 136/84 | HR 117 | Wt 289.0 lb

## 2013-11-21 DIAGNOSIS — O3421 Maternal care for scar from previous cesarean delivery: Secondary | ICD-10-CM

## 2013-11-21 DIAGNOSIS — O34219 Maternal care for unspecified type scar from previous cesarean delivery: Secondary | ICD-10-CM

## 2013-11-21 DIAGNOSIS — Z3493 Encounter for supervision of normal pregnancy, unspecified, third trimester: Secondary | ICD-10-CM

## 2013-11-21 LAB — PROTEIN / CREATININE RATIO, URINE
Creatinine, Ur: 137.4 mg/dL (ref 16.0–327.0)
Protein, Ur: 22.6 mg/dL — ABNORMAL HIGH (ref 0.0–15.0)
Protein/Creat Ratio: 164 mg/g creat (ref 0–200)

## 2013-11-21 LAB — COMPREHENSIVE METABOLIC PANEL
A/G RATIO: 1.3 (ref 1.1–2.5)
ALBUMIN: 3.2 g/dL — AB (ref 3.5–5.5)
ALT: 15 IU/L (ref 0–32)
AST: 18 IU/L (ref 0–40)
Alkaline Phosphatase: 111 IU/L (ref 39–117)
BILIRUBIN TOTAL: 0.2 mg/dL (ref 0.0–1.2)
BUN/Creatinine Ratio: 17 (ref 8–20)
BUN: 8 mg/dL (ref 6–20)
CO2: 20 mmol/L (ref 18–29)
CREATININE: 0.47 mg/dL — AB (ref 0.57–1.00)
Calcium: 9.1 mg/dL (ref 8.7–10.2)
Chloride: 105 mmol/L (ref 97–108)
GFR calc non Af Amer: 133 mL/min/{1.73_m2} (ref >59)
GFR, EST AFRICAN AMERICAN: 153 mL/min/{1.73_m2} (ref >59)
GLOBULIN, TOTAL: 2.5 g/dL (ref 1.5–4.5)
GLUCOSE: 92 mg/dL (ref 65–99)
Potassium: 4.2 mmol/L (ref 3.5–5.2)
Sodium: 140 mmol/L (ref 134–144)
TOTAL PROTEIN: 5.7 g/dL — AB (ref 6.0–8.5)

## 2013-11-21 LAB — CBC
HCT: 35.9 % (ref 34.0–46.6)
Hemoglobin: 10.6 g/dL — ABNORMAL LOW (ref 11.1–15.9)
MCH: 23.5 pg — ABNORMAL LOW (ref 26.6–33.0)
MCHC: 29.5 g/dL — ABNORMAL LOW (ref 31.5–35.7)
MCV: 80 fL (ref 79–97)
Platelets: 172 10*3/uL (ref 150–379)
RBC: 4.51 x10E6/uL (ref 3.77–5.28)
RDW: 16.5 % — ABNORMAL HIGH (ref 12.3–15.4)
WBC: 7 10*3/uL (ref 3.4–10.8)

## 2013-11-21 NOTE — Patient Instructions (Signed)
Third Trimester of Pregnancy The third trimester is from week 29 through week 42, months 7 through 9. The third trimester is a time when the fetus is growing rapidly. At the end of the ninth month, the fetus is about 20 inches in length and weighs 6-10 pounds.  BODY CHANGES Your body goes through many changes during pregnancy. The changes vary from woman to woman.   Your weight will continue to increase. You can expect to gain 25-35 pounds (11-16 kg) by the end of the pregnancy.  You may begin to get stretch marks on your hips, abdomen, and breasts.  You may urinate more often because the fetus is moving lower into your pelvis and pressing on your bladder.  You may develop or continue to have heartburn as a result of your pregnancy.  You may develop constipation because certain hormones are causing the muscles that push waste through your intestines to slow down.  You may develop hemorrhoids or swollen, bulging veins (varicose veins).  You may have pelvic pain because of the weight gain and pregnancy hormones relaxing your joints between the bones in your pelvis. Backaches may result from overexertion of the muscles supporting your posture.  You may have changes in your hair. These can include thickening of your hair, rapid growth, and changes in texture. Some women also have hair loss during or after pregnancy, or hair that feels dry or thin. Your hair will most likely return to normal after your baby is born.  Your breasts will continue to grow and be tender. A yellow discharge may leak from your breasts called colostrum.  Your belly button may stick out.  You may feel short of breath because of your expanding uterus.  You may notice the fetus "dropping," or moving lower in your abdomen.  You may have a bloody mucus discharge. This usually occurs a few days to a week before labor begins.  Your cervix becomes thin and soft (effaced) near your due date. WHAT TO EXPECT AT YOUR  PRENATAL EXAMS  You will have prenatal exams every 2 weeks until week 36. Then, you will have weekly prenatal exams. During a routine prenatal visit:  You will be weighed to make sure you and the fetus are growing normally.  Your blood pressure is taken.  Your abdomen will be measured to track your baby's growth.  The fetal heartbeat will be listened to.  Any test results from the previous visit will be discussed.  You may have a cervical check near your due date to see if you have effaced. At around 36 weeks, your caregiver will check your cervix. At the same time, your caregiver will also perform a test on the secretions of the vaginal tissue. This test is to determine if a type of bacteria, Group B streptococcus, is present. Your caregiver will explain this further. Your caregiver may ask you:  What your birth plan is.  How you are feeling.  If you are feeling the baby move.  If you have had any abnormal symptoms, such as leaking fluid, bleeding, severe headaches, or abdominal cramping.  If you have any questions. Other tests or screenings that may be performed during your third trimester include:  Blood tests that check for low iron levels (anemia).  Fetal testing to check the health, activity level, and growth of the fetus. Testing is done if you have certain medical conditions or if there are problems during the pregnancy. FALSE LABOR You may feel small, irregular contractions that   eventually go away. These are called Braxton Hicks contractions, or false labor. Contractions may last for hours, days, or even weeks before true labor sets in. If contractions come at regular intervals, intensify, or become painful, it is best to be seen by your caregiver.  SIGNS OF LABOR   Menstrual-like cramps.  Contractions that are 5 minutes apart or less.  Contractions that start on the top of the uterus and spread down to the lower abdomen and back.  A sense of increased pelvic  pressure or back pain.  A watery or bloody mucus discharge that comes from the vagina. If you have any of these signs before the 37th week of pregnancy, call your caregiver right away. You need to go to the hospital to get checked immediately. HOME CARE INSTRUCTIONS   Avoid all smoking, herbs, alcohol, and unprescribed drugs. These chemicals affect the formation and growth of the baby.  Follow your caregiver's instructions regarding medicine use. There are medicines that are either safe or unsafe to take during pregnancy.  Exercise only as directed by your caregiver. Experiencing uterine cramps is a good sign to stop exercising.  Continue to eat regular, healthy meals.  Wear a good support bra for breast tenderness.  Do not use hot tubs, steam rooms, or saunas.  Wear your seat belt at all times when driving.  Avoid raw meat, uncooked cheese, cat litter boxes, and soil used by cats. These carry germs that can cause birth defects in the baby.  Take your prenatal vitamins.  Try taking a stool softener (if your caregiver approves) if you develop constipation. Eat more high-fiber foods, such as fresh vegetables or fruit and whole grains. Drink plenty of fluids to keep your urine clear or pale yellow.  Take warm sitz baths to soothe any pain or discomfort caused by hemorrhoids. Use hemorrhoid cream if your caregiver approves.  If you develop varicose veins, wear support hose. Elevate your feet for 15 minutes, 3-4 times a day. Limit salt in your diet.  Avoid heavy lifting, wear low heal shoes, and practice good posture.  Rest a lot with your legs elevated if you have leg cramps or low back pain.  Visit your dentist if you have not gone during your pregnancy. Use a soft toothbrush to brush your teeth and be gentle when you floss.  A sexual relationship may be continued unless your caregiver directs you otherwise.  Do not travel far distances unless it is absolutely necessary and only  with the approval of your caregiver.  Take prenatal classes to understand, practice, and ask questions about the labor and delivery.  Make a trial run to the hospital.  Pack your hospital bag.  Prepare the baby's nursery.  Continue to go to all your prenatal visits as directed by your caregiver. SEEK MEDICAL CARE IF:  You are unsure if you are in labor or if your water has broken.  You have dizziness.  You have mild pelvic cramps, pelvic pressure, or nagging pain in your abdominal area.  You have persistent nausea, vomiting, or diarrhea.  You have a bad smelling vaginal discharge.  You have pain with urination. SEEK IMMEDIATE MEDICAL CARE IF:   You have a fever.  You are leaking fluid from your vagina.  You have spotting or bleeding from your vagina.  You have severe abdominal cramping or pain.  You have rapid weight loss or gain.  You have shortness of breath with chest pain.  You notice sudden or extreme swelling   of your face, hands, ankles, feet, or legs.  You have not felt your baby move in over an hour.  You have severe headaches that do not go away with medicine.  You have vision changes. Document Released: 01/19/2001 Document Revised: 01/30/2013 Document Reviewed: 03/28/2012 [employer] Patient Information 2015 [employer]. This information is not intended to replace advice given to you by your health care provider. Make sure you discuss any questions you have with your health care provider.  Breastfeeding Deciding to breastfeed is one of the best choices you can make for you and your baby. A change in hormones during pregnancy causes your breast tissue to grow and increases the number and size of your milk ducts. These hormones also allow proteins, sugars, and fats from your blood supply to make breast milk in your milk-producing glands. Hormones prevent breast milk from being released before your baby is born as well as prompt milk flow after birth. Once  breastfeeding has begun, thoughts of your baby, as well as his or her sucking or crying, can stimulate the release of milk from your milk-producing glands.  BENEFITS OF BREASTFEEDING For Your Baby  Your first milk (colostrum) helps your baby's digestive system function better.   There are antibodies in your milk that help your baby fight off infections.   Your baby has a lower incidence of asthma, allergies, and sudden infant death syndrome.   The nutrients in breast milk are better for your baby than infant formulas and are designed uniquely for your baby's needs.   Breast milk improves your baby's brain development.   Your baby is less likely to develop other conditions, such as childhood obesity, asthma, or type 2 diabetes mellitus.  For You   Breastfeeding helps to create a very special bond between you and your baby.   Breastfeeding is convenient. Breast milk is always available at the correct temperature and costs nothing.   Breastfeeding helps to burn calories and helps you lose the weight gained during pregnancy.   Breastfeeding makes your uterus contract to its prepregnancy size faster and slows bleeding (lochia) after you give birth.   Breastfeeding helps to lower your risk of developing type 2 diabetes mellitus, osteoporosis, and breast or ovarian cancer later in life. SIGNS THAT YOUR BABY IS HUNGRY Early Signs of Hunger  Increased alertness or activity.  Stretching.  Movement of the head from side to side.  Movement of the head and opening of the mouth when the corner of the mouth or cheek is stroked (rooting).  Increased sucking sounds, smacking lips, cooing, sighing, or squeaking.  Hand-to-mouth movements.  Increased sucking of fingers or hands. Late Signs of Hunger  Fussing.  Intermittent crying. Extreme Signs of Hunger Signs of extreme hunger will require calming and consoling before your baby will be able to breastfeed successfully. Do not  wait for the following signs of extreme hunger to occur before you initiate breastfeeding:   Restlessness.  A loud, strong cry.   Screaming. BREASTFEEDING BASICS Breastfeeding Initiation  Find a comfortable place to sit or lie down, with your neck and back well supported.  Place a pillow or rolled up blanket under your baby to bring him or her to the level of your breast (if you are seated). Nursing pillows are specially designed to help support your arms and your baby while you breastfeed.  Make sure that your baby's abdomen is facing your abdomen.   Gently massage your breast. With your fingertips, massage from your chest   wall toward your nipple in a circular motion. This encourages milk flow. You may need to continue this action during the feeding if your milk flows slowly.  Support your breast with 4 fingers underneath and your thumb above your nipple. Make sure your fingers are well away from your nipple and your baby's mouth.   Stroke your baby's lips gently with your finger or nipple.   When your baby's mouth is open wide enough, quickly bring your baby to your breast, placing your entire nipple and as much of the colored area around your nipple (areola) as possible into your baby's mouth.   More areola should be visible above your baby's upper lip than below the lower lip.   Your baby's tongue should be between his or her lower gum and your breast.   Ensure that your baby's mouth is correctly positioned around your nipple (latched). Your baby's lips should create a seal on your breast and be turned out (everted).  It is common for your baby to suck about 2-3 minutes in order to start the flow of breast milk. Latching Teaching your baby how to latch on to your breast properly is very important. An improper latch can cause nipple pain and decreased milk supply for you and poor weight gain in your baby. Also, if your baby is not latched onto your nipple properly, he or she  may swallow some air during feeding. This can make your baby fussy. Burping your baby when you switch breasts during the feeding can help to get rid of the air. However, teaching your baby to latch on properly is still the best way to prevent fussiness from swallowing air while breastfeeding. Signs that your baby has successfully latched on to your nipple:    Silent tugging or silent sucking, without causing you pain.   Swallowing heard between every 3-4 sucks.    Muscle movement above and in front of his or her ears while sucking.  Signs that your baby has not successfully latched on to nipple:   Sucking sounds or smacking sounds from your baby while breastfeeding.  Nipple pain. If you think your baby has not latched on correctly, slip your finger into the corner of your baby's mouth to break the suction and place it between your baby's gums. Attempt breastfeeding initiation again. Signs of Successful Breastfeeding Signs from your baby:   A gradual decrease in the number of sucks or complete cessation of sucking.   Falling asleep.   Relaxation of his or her body.   Retention of a small amount of milk in his or her mouth.   Letting go of your breast by himself or herself. Signs from you:  Breasts that have increased in firmness, weight, and size 1-3 hours after feeding.   Breasts that are softer immediately after breastfeeding.  Increased milk volume, as well as a change in milk consistency and color by the fifth day of breastfeeding.   Nipples that are not sore, cracked, or bleeding. Signs That Your Baby is Getting Enough Milk  Wetting at least 3 diapers in a 24-hour period. The urine should be clear and pale yellow by age 5 days.  At least 3 stools in a 24-hour period by age 5 days. The stool should be soft and yellow.  At least 3 stools in a 24-hour period by age 7 days. The stool should be seedy and yellow.  No loss of weight greater than 10% of birth weight  during the first 3   days of age.  Average weight gain of 4-7 ounces (113-198 g) per week after age 4 days.  Consistent daily weight gain by age 5 days, without weight loss after the age of 2 weeks. After a feeding, your baby may spit up a small amount. This is common. BREASTFEEDING FREQUENCY AND DURATION Frequent feeding will help you make more milk and can prevent sore nipples and breast engorgement. Breastfeed when you feel the need to reduce the fullness of your breasts or when your baby shows signs of hunger. This is called "breastfeeding on demand." Avoid introducing a pacifier to your baby while you are working to establish breastfeeding (the first 4-6 weeks after your baby is born). After this time you may choose to use a pacifier. Research has shown that pacifier use during the first year of a baby's life decreases the risk of sudden infant death syndrome (SIDS). Allow your baby to feed on each breast as long as he or she wants. Breastfeed until your baby is finished feeding. When your baby unlatches or falls asleep while feeding from the first breast, offer the second breast. Because newborns are often sleepy in the first few weeks of life, you may need to awaken your baby to get him or her to feed. Breastfeeding times will vary from baby to baby. However, the following rules can serve as a guide to help you ensure that your baby is properly fed:  Newborns (babies 4 weeks of age or younger) may breastfeed every 1-3 hours.  Newborns should not go longer than 3 hours during the day or 5 hours during the night without breastfeeding.  You should breastfeed your baby a minimum of 8 times in a 24-hour period until you begin to introduce solid foods to your baby at around 6 months of age. BREAST MILK PUMPING Pumping and storing breast milk allows you to ensure that your baby is exclusively fed your breast milk, even at times when you are unable to breastfeed. This is especially important if you are  going back to work while you are still breastfeeding or when you are not able to be present during feedings. Your lactation consultant can give you guidelines on how long it is safe to store breast milk.  A breast pump is a machine that allows you to pump milk from your breast into a sterile bottle. The pumped breast milk can then be stored in a refrigerator or freezer. Some breast pumps are operated by hand, while others use electricity. Ask your lactation consultant which type will work best for you. Breast pumps can be purchased, but some hospitals and breastfeeding support groups lease breast pumps on a monthly basis. A lactation consultant can teach you how to hand express breast milk, if you prefer not to use a pump.  CARING FOR YOUR BREASTS WHILE YOU BREASTFEED Nipples can become dry, cracked, and sore while breastfeeding. The following recommendations can help keep your breasts moisturized and healthy:  Avoid using soap on your nipples.   Wear a supportive bra. Although not required, special nursing bras and tank tops are designed to allow access to your breasts for breastfeeding without taking off your entire bra or top. Avoid wearing underwire-style bras or extremely tight bras.  Air dry your nipples for 3-4minutes after each feeding.   Use only cotton bra pads to absorb leaked breast milk. Leaking of breast milk between feedings is normal.   Use lanolin on your nipples after breastfeeding. Lanolin helps to maintain your skin's   normal moisture barrier. If you use pure lanolin, you do not need to wash it off before feeding your baby again. Pure lanolin is not toxic to your baby. You may also hand express a few drops of breast milk and gently massage that milk into your nipples and allow the milk to air dry. In the first few weeks after giving birth, some women experience extremely full breasts (engorgement). Engorgement can make your breasts feel heavy, warm, and tender to the touch.  Engorgement peaks within 3-5 days after you give birth. The following recommendations can help ease engorgement:  Completely empty your breasts while breastfeeding or pumping. You may want to start by applying warm, moist heat (in the shower or with warm water-soaked hand towels) just before feeding or pumping. This increases circulation and helps the milk flow. If your baby does not completely empty your breasts while breastfeeding, pump any extra milk after he or she is finished.  Wear a snug bra (nursing or regular) or tank top for 1-2 days to signal your body to slightly decrease milk production.  Apply ice packs to your breasts, unless this is too uncomfortable for you.  Make sure that your baby is latched on and positioned properly while breastfeeding. If engorgement persists after 48 hours of following these recommendations, contact your health care provider or a lactation consultant. OVERALL HEALTH CARE RECOMMENDATIONS WHILE BREASTFEEDING  Eat healthy foods. Alternate between meals and snacks, eating 3 of each per day. Because what you eat affects your breast milk, some of the foods may make your baby more irritable than usual. Avoid eating these foods if you are sure that they are negatively affecting your baby.  Drink milk, fruit juice, and water to satisfy your thirst (about 10 glasses a day).   Rest often, relax, and continue to take your prenatal vitamins to prevent fatigue, stress, and anemia.  Continue breast self-awareness checks.  Avoid chewing and smoking tobacco.  Avoid alcohol and drug use. Some medicines that may be harmful to your baby can pass through breast milk. It is important to ask your health care provider before taking any medicine, including all over-the-counter and prescription medicine as well as vitamin and herbal supplements. It is possible to become pregnant while breastfeeding. If birth control is desired, ask your health care provider about options that  will be safe for your baby. SEEK MEDICAL CARE IF:   You feel like you want to stop breastfeeding or have become frustrated with breastfeeding.  You have painful breasts or nipples.  Your nipples are cracked or bleeding.  Your breasts are red, tender, or warm.  You have a swollen area on either breast.  You have a fever or chills.  You have nausea or vomiting.  You have drainage other than breast milk from your nipples.  Your breasts do not become full before feedings by the fifth day after you give birth.  You feel sad and depressed.  Your baby is too sleepy to eat well.  Your baby is having trouble sleeping.   Your baby is wetting less than 3 diapers in a 24-hour period.  Your baby has less than 3 stools in a 24-hour period.  Your baby's skin or the white part of his or her eyes becomes yellow.   Your baby is not gaining weight by 5 days of age. SEEK IMMEDIATE MEDICAL CARE IF:   Your baby is overly tired (lethargic) and does not want to wake up and feed.  Your baby   develops an unexplained fever. Document Released: 01/25/2005 Document Revised: 01/30/2013 Document Reviewed: 07/19/2012 [employer] Patient Information 2015 [employer]. This information is not intended to replace advice given to you by your health care provider. Make sure you discuss any questions you have with your health care provider.  

## 2013-11-21 NOTE — Progress Notes (Signed)
U/S growth shows 7 lb 14 oz, 87%, vtx Labs from Monday are WNL BP ok today Has significant wrist pain and swelling and has h/o carpal tunnel--needs to come out of work as she is unable to do her job--use cock-up splints prn

## 2013-11-26 ENCOUNTER — Ambulatory Visit (INDEPENDENT_AMBULATORY_CARE_PROVIDER_SITE_OTHER): Payer: 59 | Admitting: Family Medicine

## 2013-11-26 VITALS — BP 128/80 | HR 105 | Wt 290.6 lb

## 2013-11-26 DIAGNOSIS — Z3493 Encounter for supervision of normal pregnancy, unspecified, third trimester: Secondary | ICD-10-CM

## 2013-11-26 NOTE — Progress Notes (Signed)
BP is good No contractions Wrist pain is some better Check cervix next week and consider membrane stripping Discussed timing of IOL at 41 + wks.

## 2013-11-26 NOTE — Patient Instructions (Signed)
Third Trimester of Pregnancy The third trimester is from week 29 through week 42, months 7 through 9. The third trimester is a time when the fetus is growing rapidly. At the end of the ninth month, the fetus is about 20 inches in length and weighs 6-10 pounds.  BODY CHANGES Your body goes through many changes during pregnancy. The changes vary from woman to woman.   Your weight will continue to increase. You can expect to gain 25-35 pounds (11-16 kg) by the end of the pregnancy.  You may begin to get stretch marks on your hips, abdomen, and breasts.  You may urinate more often because the fetus is moving lower into your pelvis and pressing on your bladder.  You may develop or continue to have heartburn as a result of your pregnancy.  You may develop constipation because certain hormones are causing the muscles that push waste through your intestines to slow down.  You may develop hemorrhoids or swollen, bulging veins (varicose veins).  You may have pelvic pain because of the weight gain and pregnancy hormones relaxing your joints between the bones in your pelvis. Backaches may result from overexertion of the muscles supporting your posture.  You may have changes in your hair. These can include thickening of your hair, rapid growth, and changes in texture. Some women also have hair loss during or after pregnancy, or hair that feels dry or thin. Your hair will most likely return to normal after your baby is born.  Your breasts will continue to grow and be tender. A yellow discharge may leak from your breasts called colostrum.  Your belly button may stick out.  You may feel short of breath because of your expanding uterus.  You may notice the fetus "dropping," or moving lower in your abdomen.  You may have a bloody mucus discharge. This usually occurs a few days to a week before labor begins.  Your cervix becomes thin and soft (effaced) near your due date. WHAT TO EXPECT AT YOUR  PRENATAL EXAMS  You will have prenatal exams every 2 weeks until week 36. Then, you will have weekly prenatal exams. During a routine prenatal visit:  You will be weighed to make sure you and the fetus are growing normally.  Your blood pressure is taken.  Your abdomen will be measured to track your baby's growth.  The fetal heartbeat will be listened to.  Any test results from the previous visit will be discussed.  You may have a cervical check near your due date to see if you have effaced. At around 36 weeks, your caregiver will check your cervix. At the same time, your caregiver will also perform a test on the secretions of the vaginal tissue. This test is to determine if a type of bacteria, Group B streptococcus, is present. Your caregiver will explain this further. Your caregiver may ask you:  What your birth plan is.  How you are feeling.  If you are feeling the baby move.  If you have had any abnormal symptoms, such as leaking fluid, bleeding, severe headaches, or abdominal cramping.  If you have any questions. Other tests or screenings that may be performed during your third trimester include:  Blood tests that check for low iron levels (anemia).  Fetal testing to check the health, activity level, and growth of the fetus. Testing is done if you have certain medical conditions or if there are problems during the pregnancy. FALSE LABOR You may feel small, irregular contractions that   eventually go away. These are called Braxton Hicks contractions, or false labor. Contractions may last for hours, days, or even weeks before true labor sets in. If contractions come at regular intervals, intensify, or become painful, it is best to be seen by your caregiver.  SIGNS OF LABOR   Menstrual-like cramps.  Contractions that are 5 minutes apart or less.  Contractions that start on the top of the uterus and spread down to the lower abdomen and back.  A sense of increased pelvic  pressure or back pain.  A watery or bloody mucus discharge that comes from the vagina. If you have any of these signs before the 37th week of pregnancy, call your caregiver right away. You need to go to the hospital to get checked immediately. HOME CARE INSTRUCTIONS   Avoid all smoking, herbs, alcohol, and unprescribed drugs. These chemicals affect the formation and growth of the baby.  Follow your caregiver's instructions regarding medicine use. There are medicines that are either safe or unsafe to take during pregnancy.  Exercise only as directed by your caregiver. Experiencing uterine cramps is a good sign to stop exercising.  Continue to eat regular, healthy meals.  Wear a good support bra for breast tenderness.  Do not use hot tubs, steam rooms, or saunas.  Wear your seat belt at all times when driving.  Avoid raw meat, uncooked cheese, cat litter boxes, and soil used by cats. These carry germs that can cause birth defects in the baby.  Take your prenatal vitamins.  Try taking a stool softener (if your caregiver approves) if you develop constipation. Eat more high-fiber foods, such as fresh vegetables or fruit and whole grains. Drink plenty of fluids to keep your urine clear or pale yellow.  Take warm sitz baths to soothe any pain or discomfort caused by hemorrhoids. Use hemorrhoid cream if your caregiver approves.  If you develop varicose veins, wear support hose. Elevate your feet for 15 minutes, 3-4 times a day. Limit salt in your diet.  Avoid heavy lifting, wear low heal shoes, and practice good posture.  Rest a lot with your legs elevated if you have leg cramps or low back pain.  Visit your dentist if you have not gone during your pregnancy. Use a soft toothbrush to brush your teeth and be gentle when you floss.  A sexual relationship may be continued unless your caregiver directs you otherwise.  Do not travel far distances unless it is absolutely necessary and only  with the approval of your caregiver.  Take prenatal classes to understand, practice, and ask questions about the labor and delivery.  Make a trial run to the hospital.  Pack your hospital bag.  Prepare the baby's nursery.  Continue to go to all your prenatal visits as directed by your caregiver. SEEK MEDICAL CARE IF:  You are unsure if you are in labor or if your water has broken.  You have dizziness.  You have mild pelvic cramps, pelvic pressure, or nagging pain in your abdominal area.  You have persistent nausea, vomiting, or diarrhea.  You have a bad smelling vaginal discharge.  You have pain with urination. SEEK IMMEDIATE MEDICAL CARE IF:   You have a fever.  You are leaking fluid from your vagina.  You have spotting or bleeding from your vagina.  You have severe abdominal cramping or pain.  You have rapid weight loss or gain.  You have shortness of breath with chest pain.  You notice sudden or extreme swelling   of your face, hands, ankles, feet, or legs.  You have not felt your baby move in over an hour.  You have severe headaches that do not go away with medicine.  You have vision changes. Document Released: 01/19/2001 Document Revised: 01/30/2013 Document Reviewed: 03/28/2012 [employer] Patient Information 2015 [employer]. This information is not intended to replace advice given to you by your health care provider. Make sure you discuss any questions you have with your health care provider.  Breastfeeding Deciding to breastfeed is one of the best choices you can make for you and your baby. A change in hormones during pregnancy causes your breast tissue to grow and increases the number and size of your milk ducts. These hormones also allow proteins, sugars, and fats from your blood supply to make breast milk in your milk-producing glands. Hormones prevent breast milk from being released before your baby is born as well as prompt milk flow after birth. Once  breastfeeding has begun, thoughts of your baby, as well as his or her sucking or crying, can stimulate the release of milk from your milk-producing glands.  BENEFITS OF BREASTFEEDING For Your Baby  Your first milk (colostrum) helps your baby's digestive system function better.   There are antibodies in your milk that help your baby fight off infections.   Your baby has a lower incidence of asthma, allergies, and sudden infant death syndrome.   The nutrients in breast milk are better for your baby than infant formulas and are designed uniquely for your baby's needs.   Breast milk improves your baby's brain development.   Your baby is less likely to develop other conditions, such as childhood obesity, asthma, or type 2 diabetes mellitus.  For You   Breastfeeding helps to create a very special bond between you and your baby.   Breastfeeding is convenient. Breast milk is always available at the correct temperature and costs nothing.   Breastfeeding helps to burn calories and helps you lose the weight gained during pregnancy.   Breastfeeding makes your uterus contract to its prepregnancy size faster and slows bleeding (lochia) after you give birth.   Breastfeeding helps to lower your risk of developing type 2 diabetes mellitus, osteoporosis, and breast or ovarian cancer later in life. SIGNS THAT YOUR BABY IS HUNGRY Early Signs of Hunger  Increased alertness or activity.  Stretching.  Movement of the head from side to side.  Movement of the head and opening of the mouth when the corner of the mouth or cheek is stroked (rooting).  Increased sucking sounds, smacking lips, cooing, sighing, or squeaking.  Hand-to-mouth movements.  Increased sucking of fingers or hands. Late Signs of Hunger  Fussing.  Intermittent crying. Extreme Signs of Hunger Signs of extreme hunger will require calming and consoling before your baby will be able to breastfeed successfully. Do not  wait for the following signs of extreme hunger to occur before you initiate breastfeeding:   Restlessness.  A loud, strong cry.   Screaming. BREASTFEEDING BASICS Breastfeeding Initiation  Find a comfortable place to sit or lie down, with your neck and back well supported.  Place a pillow or rolled up blanket under your baby to bring him or her to the level of your breast (if you are seated). Nursing pillows are specially designed to help support your arms and your baby while you breastfeed.  Make sure that your baby's abdomen is facing your abdomen.   Gently massage your breast. With your fingertips, massage from your chest   wall toward your nipple in a circular motion. This encourages milk flow. You may need to continue this action during the feeding if your milk flows slowly.  Support your breast with 4 fingers underneath and your thumb above your nipple. Make sure your fingers are well away from your nipple and your baby's mouth.   Stroke your baby's lips gently with your finger or nipple.   When your baby's mouth is open wide enough, quickly bring your baby to your breast, placing your entire nipple and as much of the colored area around your nipple (areola) as possible into your baby's mouth.   More areola should be visible above your baby's upper lip than below the lower lip.   Your baby's tongue should be between his or her lower gum and your breast.   Ensure that your baby's mouth is correctly positioned around your nipple (latched). Your baby's lips should create a seal on your breast and be turned out (everted).  It is common for your baby to suck about 2-3 minutes in order to start the flow of breast milk. Latching Teaching your baby how to latch on to your breast properly is very important. An improper latch can cause nipple pain and decreased milk supply for you and poor weight gain in your baby. Also, if your baby is not latched onto your nipple properly, he or she  may swallow some air during feeding. This can make your baby fussy. Burping your baby when you switch breasts during the feeding can help to get rid of the air. However, teaching your baby to latch on properly is still the best way to prevent fussiness from swallowing air while breastfeeding. Signs that your baby has successfully latched on to your nipple:    Silent tugging or silent sucking, without causing you pain.   Swallowing heard between every 3-4 sucks.    Muscle movement above and in front of his or her ears while sucking.  Signs that your baby has not successfully latched on to nipple:   Sucking sounds or smacking sounds from your baby while breastfeeding.  Nipple pain. If you think your baby has not latched on correctly, slip your finger into the corner of your baby's mouth to break the suction and place it between your baby's gums. Attempt breastfeeding initiation again. Signs of Successful Breastfeeding Signs from your baby:   A gradual decrease in the number of sucks or complete cessation of sucking.   Falling asleep.   Relaxation of his or her body.   Retention of a small amount of milk in his or her mouth.   Letting go of your breast by himself or herself. Signs from you:  Breasts that have increased in firmness, weight, and size 1-3 hours after feeding.   Breasts that are softer immediately after breastfeeding.  Increased milk volume, as well as a change in milk consistency and color by the fifth day of breastfeeding.   Nipples that are not sore, cracked, or bleeding. Signs That Your Baby is Getting Enough Milk  Wetting at least 3 diapers in a 24-hour period. The urine should be clear and pale yellow by age 5 days.  At least 3 stools in a 24-hour period by age 5 days. The stool should be soft and yellow.  At least 3 stools in a 24-hour period by age 7 days. The stool should be seedy and yellow.  No loss of weight greater than 10% of birth weight  during the first 3   days of age.  Average weight gain of 4-7 ounces (113-198 g) per week after age 4 days.  Consistent daily weight gain by age 5 days, without weight loss after the age of 2 weeks. After a feeding, your baby may spit up a small amount. This is common. BREASTFEEDING FREQUENCY AND DURATION Frequent feeding will help you make more milk and can prevent sore nipples and breast engorgement. Breastfeed when you feel the need to reduce the fullness of your breasts or when your baby shows signs of hunger. This is called "breastfeeding on demand." Avoid introducing a pacifier to your baby while you are working to establish breastfeeding (the first 4-6 weeks after your baby is born). After this time you may choose to use a pacifier. Research has shown that pacifier use during the first year of a baby's life decreases the risk of sudden infant death syndrome (SIDS). Allow your baby to feed on each breast as long as he or she wants. Breastfeed until your baby is finished feeding. When your baby unlatches or falls asleep while feeding from the first breast, offer the second breast. Because newborns are often sleepy in the first few weeks of life, you may need to awaken your baby to get him or her to feed. Breastfeeding times will vary from baby to baby. However, the following rules can serve as a guide to help you ensure that your baby is properly fed:  Newborns (babies 4 weeks of age or younger) may breastfeed every 1-3 hours.  Newborns should not go longer than 3 hours during the day or 5 hours during the night without breastfeeding.  You should breastfeed your baby a minimum of 8 times in a 24-hour period until you begin to introduce solid foods to your baby at around 6 months of age. BREAST MILK PUMPING Pumping and storing breast milk allows you to ensure that your baby is exclusively fed your breast milk, even at times when you are unable to breastfeed. This is especially important if you are  going back to work while you are still breastfeeding or when you are not able to be present during feedings. Your lactation consultant can give you guidelines on how long it is safe to store breast milk.  A breast pump is a machine that allows you to pump milk from your breast into a sterile bottle. The pumped breast milk can then be stored in a refrigerator or freezer. Some breast pumps are operated by hand, while others use electricity. Ask your lactation consultant which type will work best for you. Breast pumps can be purchased, but some hospitals and breastfeeding support groups lease breast pumps on a monthly basis. A lactation consultant can teach you how to hand express breast milk, if you prefer not to use a pump.  CARING FOR YOUR BREASTS WHILE YOU BREASTFEED Nipples can become dry, cracked, and sore while breastfeeding. The following recommendations can help keep your breasts moisturized and healthy:  Avoid using soap on your nipples.   Wear a supportive bra. Although not required, special nursing bras and tank tops are designed to allow access to your breasts for breastfeeding without taking off your entire bra or top. Avoid wearing underwire-style bras or extremely tight bras.  Air dry your nipples for 3-4minutes after each feeding.   Use only cotton bra pads to absorb leaked breast milk. Leaking of breast milk between feedings is normal.   Use lanolin on your nipples after breastfeeding. Lanolin helps to maintain your skin's   normal moisture barrier. If you use pure lanolin, you do not need to wash it off before feeding your baby again. Pure lanolin is not toxic to your baby. You may also hand express a few drops of breast milk and gently massage that milk into your nipples and allow the milk to air dry. In the first few weeks after giving birth, some women experience extremely full breasts (engorgement). Engorgement can make your breasts feel heavy, warm, and tender to the touch.  Engorgement peaks within 3-5 days after you give birth. The following recommendations can help ease engorgement:  Completely empty your breasts while breastfeeding or pumping. You may want to start by applying warm, moist heat (in the shower or with warm water-soaked hand towels) just before feeding or pumping. This increases circulation and helps the milk flow. If your baby does not completely empty your breasts while breastfeeding, pump any extra milk after he or she is finished.  Wear a snug bra (nursing or regular) or tank top for 1-2 days to signal your body to slightly decrease milk production.  Apply ice packs to your breasts, unless this is too uncomfortable for you.  Make sure that your baby is latched on and positioned properly while breastfeeding. If engorgement persists after 48 hours of following these recommendations, contact your health care provider or a lactation consultant. OVERALL HEALTH CARE RECOMMENDATIONS WHILE BREASTFEEDING  Eat healthy foods. Alternate between meals and snacks, eating 3 of each per day. Because what you eat affects your breast milk, some of the foods may make your baby more irritable than usual. Avoid eating these foods if you are sure that they are negatively affecting your baby.  Drink milk, fruit juice, and water to satisfy your thirst (about 10 glasses a day).   Rest often, relax, and continue to take your prenatal vitamins to prevent fatigue, stress, and anemia.  Continue breast self-awareness checks.  Avoid chewing and smoking tobacco.  Avoid alcohol and drug use. Some medicines that may be harmful to your baby can pass through breast milk. It is important to ask your health care provider before taking any medicine, including all over-the-counter and prescription medicine as well as vitamin and herbal supplements. It is possible to become pregnant while breastfeeding. If birth control is desired, ask your health care provider about options that  will be safe for your baby. SEEK MEDICAL CARE IF:   You feel like you want to stop breastfeeding or have become frustrated with breastfeeding.  You have painful breasts or nipples.  Your nipples are cracked or bleeding.  Your breasts are red, tender, or warm.  You have a swollen area on either breast.  You have a fever or chills.  You have nausea or vomiting.  You have drainage other than breast milk from your nipples.  Your breasts do not become full before feedings by the fifth day after you give birth.  You feel sad and depressed.  Your baby is too sleepy to eat well.  Your baby is having trouble sleeping.   Your baby is wetting less than 3 diapers in a 24-hour period.  Your baby has less than 3 stools in a 24-hour period.  Your baby's skin or the white part of his or her eyes becomes yellow.   Your baby is not gaining weight by 5 days of age. SEEK IMMEDIATE MEDICAL CARE IF:   Your baby is overly tired (lethargic) and does not want to wake up and feed.  Your baby   develops an unexplained fever. Document Released: 01/25/2005 Document Revised: 01/30/2013 Document Reviewed: 07/19/2012 [employer] Patient Information 2015 [employer]. This information is not intended to replace advice given to you by your health care provider. Make sure you discuss any questions you have with your health care provider.  

## 2013-12-03 ENCOUNTER — Ambulatory Visit (INDEPENDENT_AMBULATORY_CARE_PROVIDER_SITE_OTHER): Payer: 59 | Admitting: Family Medicine

## 2013-12-03 VITALS — BP 130/91 | HR 107 | Wt 290.0 lb

## 2013-12-03 DIAGNOSIS — Z3493 Encounter for supervision of normal pregnancy, unspecified, third trimester: Secondary | ICD-10-CM

## 2013-12-03 DIAGNOSIS — O3421 Maternal care for scar from previous cesarean delivery: Secondary | ICD-10-CM

## 2013-12-03 DIAGNOSIS — O34219 Maternal care for unspecified type scar from previous cesarean delivery: Secondary | ICD-10-CM

## 2013-12-03 NOTE — Progress Notes (Signed)
No contractions.  No concerns.  Good fetal activity.  DBP a little elevated, 134/84 on recheck.

## 2013-12-03 NOTE — Patient Instructions (Signed)
Third Trimester of Pregnancy The third trimester is from week 29 through week 42, months 7 through 9. The third trimester is a time when the fetus is growing rapidly. At the end of the ninth month, the fetus is about 20 inches in length and weighs 6-10 pounds.  BODY CHANGES Your body goes through many changes during pregnancy. The changes vary from woman to woman.   Your weight will continue to increase. You can expect to gain 25-35 pounds (11-16 kg) by the end of the pregnancy.  You may begin to get stretch marks on your hips, abdomen, and breasts.  You may urinate more often because the fetus is moving lower into your pelvis and pressing on your bladder.  You may develop or continue to have heartburn as a result of your pregnancy.  You may develop constipation because certain hormones are causing the muscles that push waste through your intestines to slow down.  You may develop hemorrhoids or swollen, bulging veins (varicose veins).  You may have pelvic pain because of the weight gain and pregnancy hormones relaxing your joints between the bones in your pelvis. Backaches may result from overexertion of the muscles supporting your posture.  You may have changes in your hair. These can include thickening of your hair, rapid growth, and changes in texture. Some women also have hair loss during or after pregnancy, or hair that feels dry or thin. Your hair will most likely return to normal after your baby is born.  Your breasts will continue to grow and be tender. A yellow discharge may leak from your breasts called colostrum.  Your belly button may stick out.  You may feel short of breath because of your expanding uterus.  You may notice the fetus "dropping," or moving lower in your abdomen.  You may have a bloody mucus discharge. This usually occurs a few days to a week before labor begins.  Your cervix becomes thin and soft (effaced) near your due date. WHAT TO EXPECT AT YOUR PRENATAL  EXAMS  You will have prenatal exams every 2 weeks until week 36. Then, you will have weekly prenatal exams. During a routine prenatal visit:  You will be weighed to make sure you and the fetus are growing normally.  Your blood pressure is taken.  Your abdomen will be measured to track your baby's growth.  The fetal heartbeat will be listened to.  Any test results from the previous visit will be discussed.  You may have a cervical check near your due date to see if you have effaced. At around 36 weeks, your caregiver will check your cervix. At the same time, your caregiver will also perform a test on the secretions of the vaginal tissue. This test is to determine if a type of bacteria, Group B streptococcus, is present. Your caregiver will explain this further. Your caregiver may ask you:  What your birth plan is.  How you are feeling.  If you are feeling the baby move.  If you have had any abnormal symptoms, such as leaking fluid, bleeding, severe headaches, or abdominal cramping.  If you have any questions. Other tests or screenings that may be performed during your third trimester include:  Blood tests that check for low iron levels (anemia).  Fetal testing to check the health, activity level, and growth of the fetus. Testing is done if you have certain medical conditions or if there are problems during the pregnancy. FALSE LABOR You may feel small, irregular contractions that   eventually go away. These are called Braxton Hicks contractions, or false labor. Contractions may last for hours, days, or even weeks before true labor sets in. If contractions come at regular intervals, intensify, or become painful, it is best to be seen by your caregiver.  SIGNS OF LABOR   Menstrual-like cramps.  Contractions that are 5 minutes apart or less.  Contractions that start on the top of the uterus and spread down to the lower abdomen and back.  A sense of increased pelvic pressure or back  pain.  A watery or bloody mucus discharge that comes from the vagina. If you have any of these signs before the 37th week of pregnancy, call your caregiver right away. You need to go to the hospital to get checked immediately. HOME CARE INSTRUCTIONS   Avoid all smoking, herbs, alcohol, and unprescribed drugs. These chemicals affect the formation and growth of the baby.  Follow your caregiver's instructions regarding medicine use. There are medicines that are either safe or unsafe to take during pregnancy.  Exercise only as directed by your caregiver. Experiencing uterine cramps is a good sign to stop exercising.  Continue to eat regular, healthy meals.  Wear a good support bra for breast tenderness.  Do not use hot tubs, steam rooms, or saunas.  Wear your seat belt at all times when driving.  Avoid raw meat, uncooked cheese, cat litter boxes, and soil used by cats. These carry germs that can cause birth defects in the baby.  Take your prenatal vitamins.  Try taking a stool softener (if your caregiver approves) if you develop constipation. Eat more high-fiber foods, such as fresh vegetables or fruit and whole grains. Drink plenty of fluids to keep your urine clear or pale yellow.  Take warm sitz baths to soothe any pain or discomfort caused by hemorrhoids. Use hemorrhoid cream if your caregiver approves.  If you develop varicose veins, wear support hose. Elevate your feet for 15 minutes, 3-4 times a day. Limit salt in your diet.  Avoid heavy lifting, wear low heal shoes, and practice good posture.  Rest a lot with your legs elevated if you have leg cramps or low back pain.  Visit your dentist if you have not gone during your pregnancy. Use a soft toothbrush to brush your teeth and be gentle when you floss.  A sexual relationship may be continued unless your caregiver directs you otherwise.  Do not travel far distances unless it is absolutely necessary and only with the approval  of your caregiver.  Take prenatal classes to understand, practice, and ask questions about the labor and delivery.  Make a trial run to the hospital.  Pack your hospital bag.  Prepare the baby's nursery.  Continue to go to all your prenatal visits as directed by your caregiver. SEEK MEDICAL CARE IF:  You are unsure if you are in labor or if your water has broken.  You have dizziness.  You have mild pelvic cramps, pelvic pressure, or nagging pain in your abdominal area.  You have persistent nausea, vomiting, or diarrhea.  You have a bad smelling vaginal discharge.  You have pain with urination. SEEK IMMEDIATE MEDICAL CARE IF:   You have a fever.  You are leaking fluid from your vagina.  You have spotting or bleeding from your vagina.  You have severe abdominal cramping or pain.  You have rapid weight loss or gain.  You have shortness of breath with chest pain.  You notice sudden or extreme swelling   of your face, hands, ankles, feet, or legs.  You have not felt your baby move in over an hour.  You have severe headaches that do not go away with medicine.  You have vision changes. Document Released: 01/19/2001 Document Revised: 01/30/2013 Document Reviewed: 03/28/2012 Exit[employer] Patient Information 2015 [employer]. This information is not intended to replace advice given to you by your health care provider. Make sure you discuss any questions you have with your health care provider.  

## 2013-12-10 ENCOUNTER — Ambulatory Visit (INDEPENDENT_AMBULATORY_CARE_PROVIDER_SITE_OTHER): Payer: 59 | Admitting: Obstetrics & Gynecology

## 2013-12-10 ENCOUNTER — Encounter: Payer: Self-pay | Admitting: Obstetrics & Gynecology

## 2013-12-10 VITALS — BP 133/88 | HR 103 | Wt 293.4 lb

## 2013-12-10 DIAGNOSIS — O3421 Maternal care for scar from previous cesarean delivery: Secondary | ICD-10-CM

## 2013-12-10 DIAGNOSIS — O34219 Maternal care for unspecified type scar from previous cesarean delivery: Secondary | ICD-10-CM

## 2013-12-10 DIAGNOSIS — Z3483 Encounter for supervision of other normal pregnancy, third trimester: Secondary | ICD-10-CM

## 2013-12-10 DIAGNOSIS — O480 Post-term pregnancy: Secondary | ICD-10-CM

## 2013-12-10 MED ORDER — BREAST PUMP MISC: 1.0000 [IU] | Freq: Every day | Status: DC | PRN

## 2013-12-10 NOTE — Progress Notes (Signed)
Feeling some increased cramping through the week, does feel like she is having increased discharge and is concerned it might be her fluid.  Patient has decided she would like her tubes tied whether she delivers vaginal or by c-section.

## 2013-12-10 NOTE — Progress Notes (Signed)
Pt reports feeling wet since her last exam. Exam today shows no ROM. She denies ctx. +FM To be scheduled for IOL for Wed 11/4 @7 :30pm Reviewed with pt that cervical ripening can only happen with Foely bulb and pitocin.  Pt aware.

## 2013-12-10 NOTE — Patient Instructions (Signed)
Labor Induction  Labor induction is when steps are taken to cause a pregnant woman to begin the labor process. Most women go into labor on their own between 37 weeks and 42 weeks of the pregnancy. When this does not happen or when there is a medical need, methods may be used to induce labor. Labor induction causes a pregnant woman's uterus to contract. It also causes the cervix to soften (ripen), open (dilate), and thin out (efface). Usually, labor is not induced before 39 weeks of the pregnancy unless there is a problem with the baby or mother.  Before inducing labor, your health care provider will consider a number of factors, including the following:  The medical condition of you and the baby.   How many weeks along you are.   The status of the baby's lung maturity.   The condition of the cervix.   The position of the baby.  WHAT ARE THE REASONS FOR LABOR INDUCTION? Labor may be induced for the following reasons:  The health of the baby or mother is at risk.   The pregnancy is overdue by 1 week or more.   The water breaks but labor does not start on its own.   The mother has a health condition or serious illness, such as high blood pressure, infection, placental abruption, or diabetes.  The amniotic fluid amounts are low around the baby.   The baby is distressed.  Convenience or wanting the baby to be born on a certain date is not a reason for inducing labor. WHAT METHODS ARE USED FOR LABOR INDUCTION? Several methods of labor induction may be used, such as:   Prostaglandin medicine. This medicine causes the cervix to dilate and ripen. The medicine will also start contractions. It can be taken by mouth or by inserting a suppository into the vagina.   Inserting a thin tube (catheter) with a balloon on the end into the vagina to dilate the cervix. Once inserted, the balloon is expanded with water, which causes the cervix to open.   Stripping the membranes. Your health  care provider separates amniotic sac tissue from the cervix, causing the cervix to be stretched and causing the release of a hormone called progesterone. This may cause the uterus to contract. It is often done during an office visit. You will be sent home to wait for the contractions to begin. You will then come in for an induction.   Breaking the water. Your health care provider makes a hole in the amniotic sac using a small instrument. Once the amniotic sac breaks, contractions should begin. This may still take hours to see an effect.   Medicine to trigger or strengthen contractions. This medicine is given through an IV access tube inserted into a vein in your arm.  All of the methods of induction, besides stripping the membranes, will be done in the hospital. Induction is done in the hospital so that you and the baby can be carefully monitored.  HOW LONG DOES IT TAKE FOR LABOR TO BE INDUCED? Some inductions can take up to 2-3 days. Depending on the cervix, it usually takes less time. It takes longer when you are induced early in the pregnancy or if this is your first pregnancy. If a mother is still pregnant and the induction has been going on for 2-3 days, either the mother will be sent home or a cesarean delivery will be needed. WHAT ARE THE RISKS ASSOCIATED WITH LABOR INDUCTION? Some of the risks of induction   include:   Changes in fetal heart rate, such as too high, too low, or erratic.   Fetal distress.   Chance of infection for the mother and baby.   Increased chance of having a cesarean delivery.   Breaking off (abruption) of the placenta from the uterus (rare).   Uterine rupture (very rare).  When induction is needed for medical reasons, the benefits of induction may outweigh the risks. WHAT ARE SOME REASONS FOR NOT INDUCING LABOR? Labor induction should not be done if:   It is shown that your baby does not tolerate labor.   You have had previous surgeries on your  uterus, such as a myomectomy or the removal of fibroids.   Your placenta lies very low in the uterus and blocks the opening of the cervix (placenta previa).   Your baby is not in a head-down position.   The umbilical cord drops down into the birth canal in front of the baby. This could cut off the baby's blood and oxygen supply.   You have had a previous cesarean delivery.   There are unusual circumstances, such as the baby being extremely premature.  Document Released: 06/16/2006 Document Revised: 09/27/2012 Document Reviewed: 08/24/2012 [employer] Patient Information 2015 [employer]. This information is not intended to replace advice given to you by your health care provider. Make sure you discuss any questions you have with your health care provider.  

## 2013-12-11 ENCOUNTER — Telehealth (HOSPITAL_COMMUNITY): Payer: Self-pay | Admitting: *Deleted

## 2013-12-11 ENCOUNTER — Encounter (HOSPITAL_COMMUNITY): Payer: Self-pay | Admitting: *Deleted

## 2013-12-11 ENCOUNTER — Ambulatory Visit (HOSPITAL_COMMUNITY)
Admission: AD | Admit: 2013-12-11 | Discharge: 2013-12-11 | Disposition: A | Discharge status: Home / Self Care | Payer: 59 | Source: Ambulatory Visit | Attending: Obstetrics & Gynecology | Admitting: Obstetrics & Gynecology

## 2013-12-11 LAB — OB RESULTS CONSOLE RUBELLA ANTIBODY, IGM: Rubella: NON-IMMUNE/NOT IMMUNE

## 2013-12-11 NOTE — Telephone Encounter (Signed)
Preadmission screen  

## 2013-12-12 ENCOUNTER — Inpatient Hospital Stay (HOSPITAL_COMMUNITY)
Admission: RE | Admit: 2013-12-12 | Discharge: 2013-12-17 | DRG: 765 | Disposition: A | Discharge status: Home / Self Care | Payer: 59 | Source: Ambulatory Visit | Attending: Obstetrics & Gynecology | Admitting: Obstetrics & Gynecology

## 2013-12-12 ENCOUNTER — Encounter (HOSPITAL_COMMUNITY): Payer: Self-pay

## 2013-12-12 DIAGNOSIS — O480 Post-term pregnancy: Secondary | ICD-10-CM | POA: Diagnosis present

## 2013-12-12 DIAGNOSIS — O133 Gestational [pregnancy-induced] hypertension without significant proteinuria, third trimester: Secondary | ICD-10-CM | POA: Diagnosis not present

## 2013-12-12 DIAGNOSIS — Z23 Encounter for immunization: Secondary | ICD-10-CM

## 2013-12-12 DIAGNOSIS — O99214 Obesity complicating childbirth: Secondary | ICD-10-CM | POA: Diagnosis present

## 2013-12-12 DIAGNOSIS — Z6841 Body Mass Index (BMI) 40.0 and over, adult: Secondary | ICD-10-CM

## 2013-12-12 DIAGNOSIS — Z3A41 41 weeks gestation of pregnancy: Secondary | ICD-10-CM | POA: Diagnosis present

## 2013-12-12 DIAGNOSIS — Z87891 Personal history of nicotine dependence: Secondary | ICD-10-CM | POA: Diagnosis not present

## 2013-12-12 DIAGNOSIS — N858 Other specified noninflammatory disorders of uterus: Secondary | ICD-10-CM | POA: Diagnosis present

## 2013-12-12 DIAGNOSIS — O3421 Maternal care for scar from previous cesarean delivery: Secondary | ICD-10-CM | POA: Diagnosis present

## 2013-12-12 DIAGNOSIS — O99820 Streptococcus B carrier state complicating pregnancy: Secondary | ICD-10-CM

## 2013-12-12 DIAGNOSIS — O99824 Streptococcus B carrier state complicating childbirth: Secondary | ICD-10-CM | POA: Diagnosis present

## 2013-12-12 DIAGNOSIS — Z3493 Encounter for supervision of normal pregnancy, unspecified, third trimester: Secondary | ICD-10-CM

## 2013-12-12 DIAGNOSIS — O34219 Maternal care for unspecified type scar from previous cesarean delivery: Secondary | ICD-10-CM

## 2013-12-12 LAB — COMPREHENSIVE METABOLIC PANEL
ALBUMIN: 2.6 g/dL — AB (ref 3.5–5.2)
ALT: 16 U/L (ref 0–35)
AST: 22 U/L (ref 0–37)
Alkaline Phosphatase: 135 U/L — ABNORMAL HIGH (ref 39–117)
Anion gap: 12 (ref 5–15)
BUN: 11 mg/dL (ref 6–23)
CALCIUM: 9 mg/dL (ref 8.4–10.5)
CO2: 20 mEq/L (ref 19–32)
CREATININE: 0.59 mg/dL (ref 0.50–1.10)
Chloride: 104 mEq/L (ref 96–112)
GFR calc Af Amer: >90 mL/min (ref >90)
GFR calc non Af Amer: >90 mL/min (ref >90)
Glucose, Bld: 82 mg/dL (ref 70–99)
Potassium: 4.1 mEq/L (ref 3.7–5.3)
Sodium: 136 mEq/L — ABNORMAL LOW (ref 137–147)
Total Bilirubin: 0.3 mg/dL (ref 0.3–1.2)
Total Protein: 5.9 g/dL — ABNORMAL LOW (ref 6.0–8.3)

## 2013-12-12 LAB — CBC
HCT: 34.8 % — ABNORMAL LOW (ref 36.0–46.0)
Hemoglobin: 11.4 g/dL — ABNORMAL LOW (ref 12.0–15.0)
MCH: 24.4 pg — AB (ref 26.0–34.0)
MCHC: 32.8 g/dL (ref 30.0–36.0)
MCV: 74.4 fL — AB (ref 78.0–100.0)
PLATELETS: 135 10*3/uL — AB (ref 150–400)
RBC: 4.68 MIL/uL (ref 3.87–5.11)
RDW: 15 % (ref 11.5–15.5)
WBC: 8.8 10*3/uL (ref 4.0–10.5)

## 2013-12-12 LAB — PROTEIN / CREATININE RATIO, URINE
Creatinine, Urine: 236.9 mg/dL
Protein Creatinine Ratio: 0.08 (ref 0.00–0.15)
Total Protein, Urine: 19.8 mg/dL

## 2013-12-12 MED ORDER — PENICILLIN G POTASSIUM 5000000 UNITS IJ SOLR
5.0000 10*6.[IU] | Freq: Once | INTRAVENOUS | Status: AC
  Administered 2013-12-12: 5 10*6.[IU] via INTRAVENOUS
  Filled 2013-12-12: qty 5

## 2013-12-12 MED ORDER — OXYCODONE-ACETAMINOPHEN 5-325 MG PO TABS: 1.0000 | ORAL_TABLET | ORAL | Status: DC | PRN

## 2013-12-12 MED ORDER — ACETAMINOPHEN 325 MG PO TABS: 650.0000 mg | ORAL_TABLET | ORAL | Status: DC | PRN

## 2013-12-12 MED ORDER — OXYTOCIN BOLUS FROM INFUSION: 500.0000 mL | INTRAVENOUS | Status: DC

## 2013-12-12 MED ORDER — OXYTOCIN 40 UNITS IN LACTATED RINGERS INFUSION - SIMPLE MED: 62.5000 mL/h | INTRAVENOUS | Status: DC

## 2013-12-12 MED ORDER — ZOLPIDEM TARTRATE 5 MG PO TABS
5.0000 mg | ORAL_TABLET | Freq: Once | ORAL | Status: AC
  Administered 2013-12-12: 5 mg via ORAL
  Filled 2013-12-12: qty 1

## 2013-12-12 MED ORDER — ONDANSETRON HCL 4 MG/2ML IJ SOLN: 4.0000 mg | Freq: Four times a day (QID) | INTRAMUSCULAR | Status: DC | PRN

## 2013-12-12 MED ORDER — LACTATED RINGERS IV SOLN
INTRAVENOUS | Status: DC
  Administered 2013-12-12: 20:00:00 via INTRAVENOUS
  Administered 2013-12-13: 125 mL/h via INTRAVENOUS
  Administered 2013-12-13 – 2013-12-15 (×6): via INTRAVENOUS

## 2013-12-12 MED ORDER — PENICILLIN G POTASSIUM 5000000 UNITS IJ SOLR
2.5000 10*6.[IU] | INTRAVENOUS | Status: DC
  Administered 2013-12-13 – 2013-12-14 (×12): 2.5 10*6.[IU] via INTRAVENOUS
  Filled 2013-12-12 (×14): qty 2.5

## 2013-12-12 MED ORDER — FLEET ENEMA 7-19 GM/118ML RE ENEM: 1.0000 | ENEMA | RECTAL | Status: DC | PRN

## 2013-12-12 MED ORDER — LACTATED RINGERS IV SOLN: 500.0000 mL | INTRAVENOUS | Status: DC | PRN

## 2013-12-12 MED ORDER — OXYCODONE-ACETAMINOPHEN 5-325 MG PO TABS: 2.0000 | ORAL_TABLET | ORAL | Status: DC | PRN

## 2013-12-12 MED ORDER — LIDOCAINE HCL (PF) 1 % IJ SOLN
30.0000 mL | INTRAMUSCULAR | Status: DC | PRN
Start: 2013-12-12 — End: 2013-12-15

## 2013-12-12 MED ORDER — CITRIC ACID-SODIUM CITRATE 334-500 MG/5ML PO SOLN: 30.0000 mL | ORAL | Status: DC | PRN

## 2013-12-12 NOTE — H&P (Signed)
Leslie Navarro is a 28 y.o. female presenting for IOL due to post-dates. Pt's previous labor resulted in failure to progress and primary LTCS of 8lbs7oz boy. Pt would like TOLAC. Pt plans to breastfeed. She would like BTL; papers are signed >2 months ago.  Maternal Medical History:  Reason for admission: Nausea. Pt is admitted for IOL due to post dates. Denies VB, LOF, ctx. +FM.  Fetal activity: Perceived fetal activity is normal.   Last perceived fetal movement was within the past hour.    Prenatal complications: Infection (GBS+).     OB History    Gravida Para Term Preterm AB TAB SAB Ectopic Multiple Living   2 1 1  0 0 0 0 0 0 1     Past Medical History  Diagnosis Date  . Medical history non-contributory    Past Surgical History  Procedure Laterality Date  . Tonsillectomy    . Cesarean section     Family History: family history includes Cancer (age of onset: 3760) in her paternal grandfather; Diabetes in her maternal grandfather. Social History:  reports that she quit smoking about 2 years ago. She has never used smokeless tobacco. She reports that she drinks alcohol. She reports that she does not use illicit drugs.   Prenatal Transfer Tool  Maternal Diabetes: No Genetic Screening: Normal Maternal Ultrasounds/Referrals: Normal Fetal Ultrasounds or other Referrals:  None Maternal Substance Abuse:  No Significant Maternal Medications:  None Significant Maternal Lab Results:  None Other Comments:  3rd trimester bps occasionally borderline.   Review of Systems  Constitutional: Negative for fever and chills.  HENT: Negative for tinnitus.   Eyes: Negative for blurred vision.  Respiratory: Negative for cough.   Cardiovascular: Negative for chest pain and palpitations.  Gastrointestinal: Negative for nausea, vomiting and diarrhea.  Genitourinary: Negative for dysuria.  Skin: Negative for rash.  Neurological: Negative for dizziness and headaches.  Psychiatric/Behavioral:  Negative for depression and substance abuse.      Blood pressure 148/88, pulse 95, temperature 98.2 F (36.8 C), temperature source Oral, height 5\' 4"  (1.626 m), weight 132.904 kg (293 lb), last menstrual period 02/28/2013. Maternal Exam:  Abdomen: Fetal presentation: vertex  Cervix: Cervix evaluated by digital exam.     Fetal Exam Fetal Monitor Review: Baseline rate: 150.  Variability: moderate (6-25 bpm).   Pattern: accelerations present and no decelerations.    Fetal State Assessment: Category I - tracings are normal.     Physical Exam  Constitutional: She is oriented to person, place, and time. She appears well-developed and well-nourished.  HENT:  Head: Normocephalic.  Eyes: EOM are normal. Pupils are equal, round, and reactive to light.  Neck: No thyromegaly present.  Cardiovascular: Normal rate.   Respiratory: Effort normal.  Lymphadenopathy:    She has no cervical adenopathy.  Neurological: She is alert and oriented to person, place, and time. She has normal reflexes.  Skin: Skin is warm.  Psychiatric: She has a normal mood and affect.    Prenatal labs: ABO, Rh:  O+ Antibody:  pending Rubella:  pending RPR: Nonreactive (08/03 0000)  HBsAg: Negative (03/02 1051)  HIV: Non-reactive (08/03 0000)  GBS: Positive (10/08 0000)   Assessment/Plan: Leslie Navarro is a 28 y.o. G2P1001 at 6534w0d here for IOL and TOLAC due to postdates.  #Induction of Labor in TOLAC: --Admit to birthing suites on L&D --IV access --Foley bulb, no cytotec as is TOLAC --Low dose pitocin after FB --Light diet until pitocin  #Pain:  --Epidural if  desired --Fentanyl 100mcg q 2hr prn in early labor  #FWB:  --Continuous fetal monitoring --NST reactive. Category I.  #ID:   --GBS Positive --Penicillin 2.5 million units once active labor begins  #MOF:  --Plans to breastfeed  #MOC: --Would like BTL. Papers signed >2 months ago.  --Schedule after delivery   Aldona BarKoch, Kari  L 12/12/2013, 9:59 PM

## 2013-12-12 NOTE — Plan of Care (Signed)
Problem: Phase I Progression Outcomes Goal: Medications/IV Fluids N/A Outcome: Completed/Met Date Met:  12/12/13

## 2013-12-12 NOTE — Progress Notes (Signed)
LABOR PROGRESS NOTE  Leslie Navarro is a 28 y.o. G2P1001 at 5067w0d  admitted for induction of labor and TOLAC due to Post dates. Due date 12/05/2013.  Subjective: Pt currently feeling well. +FM, denies LOF, VB, ctx.  Objective: BP 148/88 mmHg  Pulse 95  Temp(Src) 98.2 F (36.8 C) (Oral)  Resp 20  Ht 5\' 4"  (1.626 m)  Wt 132.904 kg (293 lb)  BMI 50.27 kg/m2  LMP 02/28/2013 or  Filed Vitals:   12/12/13 2025 12/12/13 2050 12/12/13 2055  BP:  148/88   Pulse:  95   Temp:   98.2 F (36.8 C)  TempSrc:   Oral  Resp:  20   Height: 5\' 4"  (1.626 m)    Weight: 132.904 kg (293 lb)         FHT:  FHR: 150 bpm, variability: moderate,  accelerations:  Present,  decelerations:  Absent UC:   none SVE:   Dilation: 1.5 Effacement (%): 50 Station: -3 Exam by:: Dr Alycia RossettiKoch   Labs: Lab Results  Component Value Date   WBC 8.8 12/12/2013   HGB 11.4* 12/12/2013   HCT 34.8* 12/12/2013   MCV 74.4* 12/12/2013   PLT 135* 12/12/2013    Assessment / Plan: IOL due to post-dates. TOLAC. FB placed at 2200.  Labor: Progressing normally Fetal Wellbeing:  Category I Pain Control:  Labor support without medications Anticipated MOD:  NSVD  Leslie Navarro, Leslie Navarro, Leslie Navarro 12/12/2013, 10:17 PM\  I have seen and examined this patient and agree the above assessment. Leslie Navarro,Leslie Navarro 12/13/2013 6:13 AM

## 2013-12-12 NOTE — Plan of Care (Signed)
Problem: Phase I Progression Outcomes Goal: Obtain and review prenatal records Outcome: Completed/Met Date Met:  12/12/13     

## 2013-12-13 LAB — RPR

## 2013-12-13 LAB — HIV ANTIBODY (ROUTINE TESTING W REFLEX): HIV 1&2 Ab, 4th Generation: NONREACTIVE

## 2013-12-13 LAB — ABO/RH: ABO/RH(D): O POS

## 2013-12-13 MED ORDER — DIPHENHYDRAMINE HCL 25 MG PO CAPS
25.0000 mg | ORAL_CAPSULE | Freq: Four times a day (QID) | ORAL | Status: DC | PRN
  Administered 2013-12-13 – 2013-12-14 (×4): 25 mg via ORAL
  Filled 2013-12-13 (×4): qty 1

## 2013-12-13 MED ORDER — TERBUTALINE SULFATE 1 MG/ML IJ SOLN
0.2500 mg | Freq: Once | INTRAMUSCULAR | Status: AC | PRN
Start: 2013-12-13 — End: 2013-12-13

## 2013-12-13 MED ORDER — OXYTOCIN 40 UNITS IN LACTATED RINGERS INFUSION - SIMPLE MED
1.0000 m[IU]/min | INTRAVENOUS | Status: DC
  Administered 2013-12-13 – 2013-12-14 (×2): 2 m[IU]/min via INTRAVENOUS
  Administered 2013-12-14: 20 m[IU]/min via INTRAVENOUS
  Filled 2013-12-13 (×2): qty 1000

## 2013-12-13 NOTE — Plan of Care (Signed)
Problem: Phase I Progression Outcomes Goal: Assess per MD/Nurse,Routine-VS,FHR,UC,Head to Toe assess Outcome: Completed/Met Date Met:  12/13/13

## 2013-12-13 NOTE — Progress Notes (Signed)
   Leslie ConroyKimberlee S Navarro is a 28 y.o. G2P1001 at 2839w1d  admitted for induction of labor due to Post dates. Now meets criteria for GHTN without preeclampsia.  Subjective: No complaints.   Objective: Filed Vitals:   12/13/13 0900 12/13/13 0930 12/13/13 1000 12/13/13 1030  BP: 136/63 131/72 129/81 138/73  Pulse: 116 103 107 108  Temp: 98.5 F (36.9 C)     TempSrc: Oral     Resp: 20     Height:      Weight:          FHT:  FHR: 145 bpm, variability: moderate,  accelerations:  Present,  decelerations:  Absent UC:   Irregular and mild Dilation: 2.5 Effacement (%): 50 Station: -3 Presentation: Vertex (confirmed by ultrasound per physician) Exam by:: Dr. Lennie Navarro   Labs:  Lab Results  Component Value Date   WBC 8.8 12/12/2013   HGB 11.4* 12/12/2013   HCT 34.8* 12/12/2013   MCV 74.4* 12/12/2013   PLT 135* 12/12/2013   Pr/Cr ratio .o8  Assessment / Plan: IOL for postdates, now with GHTN, no preeclampsia or severe range bp TOLAC S/p foley bulb, though only dilated to 2.5. Difficult exam. Unable to place second foley bulb.  Labor: ripeing phase, will proceed with low dose pitocin x4hrs, then recheck and possible reattempt FB Fetal Wellbeing:  Category I Pain Control:  Labor support without medications Anticipated MOD:  NSVD  Leslie Navarro, Leslie Navarro 12/13/2013, 11:21 AM

## 2013-12-13 NOTE — Progress Notes (Addendum)
Patient ID: Earnest ConroyKimberlee S Herms, female   DOB: 1985/11/02, 28 y.o.   MRN: [id number] Earnest ConroyKimberlee S Schuermann is a 28 y.o. G2P1001 at 7955w1d admitted for IOL indicated by postdates; morbid obesity; TOLAC Dating by LMP= Initial US at 7226w5d  Subjective: Comfortable. Not feeling UCs. Had pitocin break.  Objective: BP 127/78 mmHg  Pulse 97  Temp(Src) 98.6 F (37 C) (Oral)  Resp 20  Ht 5\' 4"  (1.626 m)  Wt 132.904 kg (293 lb)  BMI 50.27 kg/m2  LMP 02/28/2013  Fetal Heart FHR: 140 bpm, variability: moderate,  accelerations:  Present,  decelerations:  Absent   Contractions: q3-4 x 45 sec, pitocin at 8mu  SVE:   Dilation: 5 Effacement (%): 50 Station: -3 Exam by:: Felkel,RN VE deferred  Bedside US confirms cephalic (FH monitored RUQ).   Assessment / Plan:  Labor: not in established labor Fetal Wellbeing: Cat 1 Pain Control:  n/a Expected mode of delivery: TOLAC, hopeful for SVD  Brownie Gockel 12/13/2013, 11:34 PM

## 2013-12-13 NOTE — Progress Notes (Signed)
   Leslie ConroyKimberlee S Navarro is a 28 y.o. G2P1001 at 9178w1d  admitted for induction of labor due to Post dates, now with gestational HTN  Subjective: No complaints.   Objective: Filed Vitals:   12/13/13 1534 12/13/13 1602 12/13/13 1631 12/13/13 1701  BP: 142/69 130/79 129/83 148/79  Pulse: 94 98 97 107  Temp:  98 F (36.7 C)    TempSrc:  Oral    Resp:  20    Height:      Weight:          FHT:  FHR: 145 bpm, variability: moderate,  accelerations:  Present,  decelerations:  Absent UC:   regular  Dilation: 5 Effacement (%): 50 Station: -3 Presentation: Vertex Exam by:: Dr. Lennie HummerAcostas   Labs:  Lab Results  Component Value Date   WBC 8.8 12/12/2013   HGB 11.4* 12/12/2013   HCT 34.8* 12/12/2013   MCV 74.4* 12/12/2013   PLT 135* 12/12/2013   Pr/Cr ratio .o8  Assessment / Plan: IOL for postdates, now with GHTN, no preeclampsia or severe range bp  Labor: ripeing phase, cervix still very thick although very soft.  Will give pitocin break, pt may shower and eat.  Will then proceed with low dose pitocin until midnight and increase.  AROM when able. Fetal Wellbeing:  Category I Pain Control:  Labor support without medications Anticipated MOD:  NSVD  Leslie Navarro 12/13/2013, 5:24 PM

## 2013-12-13 NOTE — Plan of Care (Signed)
Problem: Phase I Progression Outcomes Goal: OOB as tolerated unless otherwise ordered Outcome: Progressing     

## 2013-12-13 NOTE — Progress Notes (Signed)
   Leslie ConroyKimberlee S Navarro is a 28 y.o. G2P1001 at 6244w1d  admitted for induction of labor due to Post dates. Now meets criteria for GHTN without preeclampsia.  Subjective: Sleeping.  No c/o HA or blurred vision earlier  Objective: Filed Vitals:   12/12/13 2055 12/12/13 2229 12/13/13 0130 12/13/13 0440  BP:  140/81 150/66 134/69  Pulse:  110 112 100  Temp: 98.2 F (36.8 C)  98.8 F (37.1 C) 98.3 F (36.8 C)  TempSrc: Oral  Oral Oral  Resp:  18    Height:      Weight:          FHT:  FHR: 145 bpm, variability: moderate,  accelerations:  Present,  decelerations:  Absent UC:   Irregular and mild SVE:   Foley still in place  Labs:  Lab Results  Component Value Date   WBC 8.8 12/12/2013   HGB 11.4* 12/12/2013   HCT 34.8* 12/12/2013   MCV 74.4* 12/12/2013   PLT 135* 12/12/2013   Pr/Cr ratio .o8  Assessment / Plan: IOL for postdates, now with GHTN, no preeclampsia or severe range bp [facility] Plan pitocin when foley falls out  Labor: ripeing phase Fetal Wellbeing:  Category I Pain Control:  Labor support without medications Anticipated MOD:  NSVD  Navarro,Leslie Kreiter 12/13/2013, 6:25 AM

## 2013-12-13 NOTE — Plan of Care (Signed)
Problem: Phase I Progression Outcomes Goal: Pitocin as ordered Outcome: Progressing

## 2013-12-14 LAB — PREPARE RBC (CROSSMATCH)

## 2013-12-14 LAB — RUBELLA ANTIBODY, IGM: Rubella IgM: 0.32 (ref <0.91)

## 2013-12-14 MED ORDER — LACTATED RINGERS IV SOLN
INTRAVENOUS | Status: DC
Start: 2013-12-15 — End: 2013-12-15
  Administered 2013-12-15: 07:00:00 via INTRAVENOUS

## 2013-12-14 MED ORDER — METOCLOPRAMIDE HCL 10 MG PO TABS
10.0000 mg | ORAL_TABLET | Freq: Once | ORAL | Status: AC
  Administered 2013-12-15: 10 mg via ORAL
  Filled 2013-12-14: qty 1

## 2013-12-14 MED ORDER — FAMOTIDINE 20 MG PO TABS
40.0000 mg | ORAL_TABLET | Freq: Once | ORAL | Status: AC
  Administered 2013-12-15: 40 mg via ORAL
  Filled 2013-12-14: qty 2

## 2013-12-14 MED ORDER — ZOLPIDEM TARTRATE 5 MG PO TABS: 5.0000 mg | ORAL_TABLET | Freq: Every evening | ORAL | Status: DC | PRN

## 2013-12-14 MED ORDER — CITRIC ACID-SODIUM CITRATE 334-500 MG/5ML PO SOLN
30.0000 mL | Freq: Once | ORAL | Status: AC
  Administered 2013-12-15: 30 mL via ORAL
  Filled 2013-12-14: qty 15

## 2013-12-14 MED ORDER — ZOLPIDEM TARTRATE 5 MG PO TABS
10.0000 mg | ORAL_TABLET | Freq: Every evening | ORAL | Status: DC | PRN
  Administered 2013-12-14 (×2): 10 mg via ORAL
  Filled 2013-12-14 (×2): qty 2

## 2013-12-14 NOTE — Plan of Care (Signed)
Problem: Phase I Progression Outcomes Goal: Pitocin as ordered Outcome: Completed/Met Date Met:  12/14/13

## 2013-12-14 NOTE — Progress Notes (Signed)
Leslie ConroyKimberlee S Navarro is a 28 y.o. G2P1001 at 4769w2d by ultrasound admitted for induction of labor due to Post dates. Due date 12/05/13.  Subjective: Not feeling contractions much. No complaints  Objective: BP 134/85 mmHg  Pulse 100  Temp(Src) 98.5 F (36.9 C) (Oral)  Resp 20  Ht 5\' 4"  (1.626 m)  Wt 132.904 kg (293 lb)  BMI 50.27 kg/m2  LMP 02/28/2013      FHT:  FHR: 140 bpm, variability: moderate,  accelerations:  Present,  decelerations:  Absent UC:   irregular, every 3 minutes SVE:   Dilation: 5 Effacement (%): 50 Station: -3 Exam by:: Ernesha Ramone/Sowder  Pitocin at 9320mu/min  Labs: Lab Results  Component Value Date   WBC 8.8 12/12/2013   HGB 11.4* 12/12/2013   HCT 34.8* 12/12/2013   MCV 74.4* 12/12/2013   PLT 135* 12/12/2013    Assessment / Plan: Induction of labor due to postterm,  Slow progression   Labor: IOL 2/2 postdates, progressing slowly. Will give pitocin break Preeclampsia:  n/a Fetal Wellbeing:  Category I Pain Control:  Labor support without medications I/D:  n/a Anticipated MOD:  NSVD  Jacquiline Doearker, Jaylene Arrowood 12/14/2013, 10:16 AM

## 2013-12-14 NOTE — Progress Notes (Signed)
Earnest ConroyKimberlee S Hewins is a 28 y.o. G2P1001 at [redacted]w[redacted]d by ultrasound admitted on 12/12/13 for induction of labor/TOLAC due to post dates.  Subjective: Patient is considering stopping TOLAC given lack of progress.  No acute symptoms.  Objective: BP 136/87 mmHg  Pulse 109  Temp(Src) 98 F (36.7 C) (Axillary)  Resp 20  Ht 5\' 4"  (1.626 m)  Wt 293 lb (132.904 kg)  BMI 50.27 kg/m2  LMP 02/28/2013   FHT:  FHR: 155 bpm, variability: moderate,  accelerations:  Present,  decelerations:  Absent UC:   irregular, every 3 minutes SVE:   Dilation: 5 Effacement (%): 50 Station: -3 Exam by:: Sowder,RNC  Pitocin at 12 mu/min  Labs: Lab Results  Component Value Date   WBC 8.8 12/12/2013   HGB 11.4* 12/12/2013   HCT 34.8* 12/12/2013   MCV 74.4* 12/12/2013   PLT 135* 12/12/2013    Assessment / Plan: Had a lengthy discussion with patient and her family regarding continuing TOLAC cs elective RCS.  Patient is not in active labor yet and can continue pitocin per protocol.  Category I FHR tracing. Explained that if she desired an elective RCS, the pitocin would be stopped and she will be scheduled for the procedure in the morning after being NPO after midnight.  All questions answered.  Patient will consider her options, but will continue TOLAC until she makes a decision.       Tereso NewcomerANYANWU,Minnette Merida A, MD 12/14/2013, 4:22 PM

## 2013-12-14 NOTE — Progress Notes (Signed)
Leslie ConroyKimberlee S Fennimore is a 28 y.o. G2P1001 at 5867w2d by ultrasound admitted for induction of labor due to Post dates. Due date 12/05/13 .  Subjective: Not feeling contractions much Wants Ambien for sleep  Objective: BP 127/78 mmHg  Pulse 97  Temp(Src) 98.6 F (37 C) (Oral)  Resp 20  Ht 5\' 4"  (1.626 m)  Wt 293 lb (132.904 kg)  BMI 50.27 kg/m2  LMP 02/28/2013      FHT:  FHR: 140 bpm, variability: moderate,  accelerations:  Present,  decelerations:  Absent UC:   irregular, every 3 minutes SVE:   Dilation: 5 Effacement (%): 50 Station: -3 Exam by:: Felkel,RN  Pitocin at 4314mu/min  Labs: Lab Results  Component Value Date   WBC 8.8 12/12/2013   HGB 11.4* 12/12/2013   HCT 34.8* 12/12/2013   MCV 74.4* 12/12/2013   PLT 135* 12/12/2013    Assessment / Plan: Induction of labor due to postterm,  progressing well on pitocin  Labor: Progressing slowly on Pitocin, latent phase Preeclampsia:  n/a Fetal Wellbeing:  Category I Pain Control:  Labor support without medications I/D:  n/a Anticipated MOD:  NSVD  Trejon Duford 12/14/2013, 1:01 AM

## 2013-12-14 NOTE — Plan of Care (Signed)
Problem: Phase I Progression Outcomes Goal: OOB as tolerated unless otherwise ordered Outcome: Completed/Met Date Met:  12/14/13

## 2013-12-14 NOTE — Progress Notes (Signed)
Leslie ConroyKimberlee S Navarro is a 28 y.o. G2P1001 at 6566w2d by ultrasound admitted for induction of labor due to Post dates. Due date 12/05/13.  Subjective: No complaints.  Objective: BP 138/75 mmHg  Pulse 115  Temp(Src) 98.1 F (36.7 C) (Axillary)  Resp 20  Ht 5\' 4"  (1.626 m)  Wt 132.904 kg (293 lb)  BMI 50.27 kg/m2  LMP 02/28/2013      FHT:  FHR: 155 bpm, variability: moderate,  accelerations:  Present,  decelerations:  Absent UC:   irregular, every 3 minutes SVE:   Dilation: 5 Effacement (%): 50 Station: -3 Exam by:: Sowder,RNC  Labs: Lab Results  Component Value Date   WBC 8.8 12/12/2013   HGB 11.4* 12/12/2013   HCT 34.8* 12/12/2013   MCV 74.4* 12/12/2013   PLT 135* 12/12/2013    Assessment / Plan: Induction of labor due to postterm,  Slow progression, on pit break for 4 hours.  Labor: IOL 2/2 postdates, progressing slowly. Pitocin restarted @1416 . Preeclampsia:  n/a Fetal Wellbeing:  Category I Pain Control:  Labor support without medications I/D:  n/a Anticipated MOD:  NSVD  Leslie Navarro, Leslie Navarro 12/14/2013, 3:55 PM

## 2013-12-15 ENCOUNTER — Inpatient Hospital Stay (HOSPITAL_COMMUNITY): Payer: 59 | Admitting: Anesthesiology

## 2013-12-15 ENCOUNTER — Encounter (HOSPITAL_COMMUNITY): Payer: Self-pay

## 2013-12-15 ENCOUNTER — Encounter (HOSPITAL_COMMUNITY)
Admission: RE | Disposition: A | Discharge status: Home / Self Care | Payer: Self-pay | Source: Ambulatory Visit | Attending: Obstetrics & Gynecology

## 2013-12-15 DIAGNOSIS — O480 Post-term pregnancy: Secondary | ICD-10-CM

## 2013-12-15 DIAGNOSIS — Z3A41 41 weeks gestation of pregnancy: Secondary | ICD-10-CM

## 2013-12-15 LAB — CBC
HCT: 34.2 % — ABNORMAL LOW (ref 36.0–46.0)
HEMOGLOBIN: 11 g/dL — AB (ref 12.0–15.0)
MCH: 24 pg — AB (ref 26.0–34.0)
MCHC: 32.2 g/dL (ref 30.0–36.0)
MCV: 74.7 fL — ABNORMAL LOW (ref 78.0–100.0)
Platelets: 130 10*3/uL — ABNORMAL LOW (ref 150–400)
RBC: 4.58 MIL/uL (ref 3.87–5.11)
RDW: 15.2 % (ref 11.5–15.5)
WBC: 5.6 10*3/uL (ref 4.0–10.5)

## 2013-12-15 LAB — PREPARE RBC (CROSSMATCH)

## 2013-12-15 SURGERY — Surgical Case
Anesthesia: Spinal | Site: Abdomen

## 2013-12-15 MED ORDER — DIBUCAINE 1 % RE OINT: 1.0000 "application " | TOPICAL_OINTMENT | RECTAL | Status: DC | PRN

## 2013-12-15 MED ORDER — MEPERIDINE HCL 25 MG/ML IJ SOLN: 6.2500 mg | INTRAMUSCULAR | Status: DC | PRN

## 2013-12-15 MED ORDER — TETANUS-DIPHTH-ACELL PERTUSSIS 5-2.5-18.5 LF-MCG/0.5 IM SUSP: 0.5000 mL | Freq: Once | INTRAMUSCULAR | Status: DC

## 2013-12-15 MED ORDER — SIMETHICONE 80 MG PO CHEW
80.0000 mg | CHEWABLE_TABLET | Freq: Three times a day (TID) | ORAL | Status: DC
  Administered 2013-12-16 – 2013-12-17 (×3): 80 mg via ORAL
  Filled 2013-12-15 (×4): qty 1

## 2013-12-15 MED ORDER — ACETAMINOPHEN 500 MG PO TABS
1000.0000 mg | ORAL_TABLET | Freq: Four times a day (QID) | ORAL | Status: AC
  Administered 2013-12-15 – 2013-12-16 (×3): 1000 mg via ORAL
  Filled 2013-12-15 (×3): qty 2

## 2013-12-15 MED ORDER — SIMETHICONE 80 MG PO CHEW
80.0000 mg | CHEWABLE_TABLET | ORAL | Status: DC | PRN
  Administered 2013-12-15 – 2013-12-16 (×2): 80 mg via ORAL

## 2013-12-15 MED ORDER — PHENYLEPHRINE 8 MG IN D5W 100 ML (0.08MG/ML) PREMIX OPTIME
INJECTION | INTRAVENOUS | Status: DC | PRN
  Administered 2013-12-15: 60 ug/min via INTRAVENOUS

## 2013-12-15 MED ORDER — DIPHENHYDRAMINE HCL 25 MG PO CAPS: 25.0000 mg | ORAL_CAPSULE | Freq: Four times a day (QID) | ORAL | Status: DC | PRN

## 2013-12-15 MED ORDER — NALBUPHINE HCL 10 MG/ML IJ SOLN: 5.0000 mg | INTRAMUSCULAR | Status: DC | PRN

## 2013-12-15 MED ORDER — SIMETHICONE 80 MG PO CHEW
80.0000 mg | CHEWABLE_TABLET | ORAL | Status: DC
Start: 2013-12-16 — End: 2013-12-17
  Filled 2013-12-15 (×2): qty 1

## 2013-12-15 MED ORDER — MENTHOL 3 MG MT LOZG: 1.0000 | LOZENGE | OROMUCOSAL | Status: DC | PRN

## 2013-12-15 MED ORDER — ONDANSETRON HCL 4 MG/2ML IJ SOLN
INTRAMUSCULAR | Status: DC | PRN
  Administered 2013-12-15: 4 mg via INTRAVENOUS

## 2013-12-15 MED ORDER — NALOXONE HCL 1 MG/ML IJ SOLN
1.0000 ug/kg/h | INTRAVENOUS | Status: DC | PRN
  Filled 2013-12-15: qty 2

## 2013-12-15 MED ORDER — SODIUM CHLORIDE 0.9 % IJ SOLN: 3.0000 mL | INTRAMUSCULAR | Status: DC | PRN

## 2013-12-15 MED ORDER — MORPHINE SULFATE 0.5 MG/ML IJ SOLN
INTRAMUSCULAR | Status: AC
  Filled 2013-12-15: qty 10

## 2013-12-15 MED ORDER — IBUPROFEN 600 MG PO TABS: 600.0000 mg | ORAL_TABLET | Freq: Four times a day (QID) | ORAL | Status: DC | PRN

## 2013-12-15 MED ORDER — PROMETHAZINE HCL 25 MG/ML IJ SOLN
6.2500 mg | INTRAMUSCULAR | Status: DC | PRN
  Administered 2013-12-15: 6.25 mg via INTRAVENOUS

## 2013-12-15 MED ORDER — MIDAZOLAM HCL 2 MG/2ML IJ SOLN: 0.5000 mg | Freq: Once | INTRAMUSCULAR | Status: DC | PRN

## 2013-12-15 MED ORDER — FENTANYL CITRATE 0.05 MG/ML IJ SOLN
INTRAMUSCULAR | Status: AC
  Filled 2013-12-15: qty 2

## 2013-12-15 MED ORDER — FENTANYL CITRATE 0.05 MG/ML IJ SOLN
INTRAMUSCULAR | Status: AC
  Administered 2013-12-15: 25 ug via INTRAVENOUS
  Filled 2013-12-15: qty 2

## 2013-12-15 MED ORDER — OXYCODONE-ACETAMINOPHEN 5-325 MG PO TABS: 1.0000 | ORAL_TABLET | ORAL | Status: DC | PRN

## 2013-12-15 MED ORDER — DEXTROSE 5 % IV SOLN
3.0000 g | INTRAVENOUS | Status: DC | PRN
  Administered 2013-12-15: 3 g via INTRAVENOUS

## 2013-12-15 MED ORDER — NALBUPHINE HCL 10 MG/ML IJ SOLN: 5.0000 mg | Freq: Once | INTRAMUSCULAR | Status: AC | PRN

## 2013-12-15 MED ORDER — ONDANSETRON HCL 4 MG/2ML IJ SOLN
INTRAMUSCULAR | Status: AC
  Filled 2013-12-15: qty 2

## 2013-12-15 MED ORDER — ERYTHROMYCIN 5 MG/GM OP OINT
TOPICAL_OINTMENT | OPHTHALMIC | Status: AC
  Filled 2013-12-15: qty 1

## 2013-12-15 MED ORDER — MORPHINE SULFATE (PF) 0.5 MG/ML IJ SOLN
INTRAMUSCULAR | Status: DC | PRN
  Administered 2013-12-15: .15 mg via INTRATHECAL

## 2013-12-15 MED ORDER — DIPHENHYDRAMINE HCL 25 MG PO CAPS: 25.0000 mg | ORAL_CAPSULE | ORAL | Status: DC | PRN

## 2013-12-15 MED ORDER — LANOLIN HYDROUS EX OINT: 1.0000 "application " | TOPICAL_OINTMENT | CUTANEOUS | Status: DC | PRN

## 2013-12-15 MED ORDER — SENNOSIDES-DOCUSATE SODIUM 8.6-50 MG PO TABS
2.0000 | ORAL_TABLET | ORAL | Status: DC
  Administered 2013-12-15 – 2013-12-16 (×2): 2 via ORAL
  Filled 2013-12-15 (×2): qty 2

## 2013-12-15 MED ORDER — OXYTOCIN 10 UNIT/ML IJ SOLN
INTRAMUSCULAR | Status: AC
  Filled 2013-12-15: qty 4

## 2013-12-15 MED ORDER — SCOPOLAMINE 1 MG/3DAYS TD PT72
1.0000 | MEDICATED_PATCH | Freq: Once | TRANSDERMAL | Status: DC
  Administered 2013-12-15: 1.5 mg via TRANSDERMAL

## 2013-12-15 MED ORDER — ONDANSETRON HCL 4 MG/2ML IJ SOLN: 4.0000 mg | INTRAMUSCULAR | Status: DC | PRN

## 2013-12-15 MED ORDER — WITCH HAZEL-GLYCERIN EX PADS: 1.0000 "application " | MEDICATED_PAD | CUTANEOUS | Status: DC | PRN

## 2013-12-15 MED ORDER — FENTANYL CITRATE 0.05 MG/ML IJ SOLN
INTRAMUSCULAR | Status: DC | PRN
  Administered 2013-12-15: 25 ug via INTRATHECAL

## 2013-12-15 MED ORDER — FENTANYL CITRATE 0.05 MG/ML IJ SOLN
25.0000 ug | INTRAMUSCULAR | Status: DC | PRN
  Administered 2013-12-15: 25 ug via INTRAVENOUS

## 2013-12-15 MED ORDER — SCOPOLAMINE 1 MG/3DAYS TD PT72
MEDICATED_PATCH | TRANSDERMAL | Status: AC
  Administered 2013-12-15: 1.5 mg via TRANSDERMAL
  Filled 2013-12-15: qty 1

## 2013-12-15 MED ORDER — OXYTOCIN 10 UNIT/ML IJ SOLN
40.0000 [IU] | INTRAVENOUS | Status: DC | PRN
  Administered 2013-12-15: 40 [IU] via INTRAVENOUS

## 2013-12-15 MED ORDER — ONDANSETRON HCL 4 MG PO TABS: 4.0000 mg | ORAL_TABLET | ORAL | Status: DC | PRN

## 2013-12-15 MED ORDER — IBUPROFEN 600 MG PO TABS
600.0000 mg | ORAL_TABLET | Freq: Four times a day (QID) | ORAL | Status: DC
  Administered 2013-12-15 – 2013-12-17 (×7): 600 mg via ORAL
  Filled 2013-12-15 (×7): qty 1

## 2013-12-15 MED ORDER — LACTATED RINGERS IV SOLN
INTRAVENOUS | Status: DC
  Administered 2013-12-16: 02:00:00 via INTRAVENOUS

## 2013-12-15 MED ORDER — PRENATAL MULTIVITAMIN CH
1.0000 | ORAL_TABLET | Freq: Every day | ORAL | Status: DC
  Administered 2013-12-16 – 2013-12-17 (×2): 1 via ORAL
  Filled 2013-12-15 (×2): qty 1

## 2013-12-15 MED ORDER — ONDANSETRON HCL 4 MG/2ML IJ SOLN: 4.0000 mg | Freq: Three times a day (TID) | INTRAMUSCULAR | Status: DC | PRN

## 2013-12-15 MED ORDER — PHENYLEPHRINE 8 MG IN D5W 100 ML (0.08MG/ML) PREMIX OPTIME
INJECTION | INTRAVENOUS | Status: AC
  Filled 2013-12-15: qty 100

## 2013-12-15 MED ORDER — ZOLPIDEM TARTRATE 5 MG PO TABS: 5.0000 mg | ORAL_TABLET | Freq: Every evening | ORAL | Status: DC | PRN

## 2013-12-15 MED ORDER — BUPIVACAINE IN DEXTROSE 0.75-8.25 % IT SOLN
INTRATHECAL | Status: DC | PRN
  Administered 2013-12-15: 1.4 mL via INTRATHECAL

## 2013-12-15 MED ORDER — DIPHENHYDRAMINE HCL 50 MG/ML IJ SOLN
12.5000 mg | INTRAMUSCULAR | Status: DC | PRN
  Administered 2013-12-15 (×2): 12.5 mg via INTRAVENOUS
  Filled 2013-12-15 (×2): qty 1

## 2013-12-15 MED ORDER — NALOXONE HCL 0.4 MG/ML IJ SOLN: 0.4000 mg | INTRAMUSCULAR | Status: DC | PRN

## 2013-12-15 MED ORDER — OXYTOCIN 40 UNITS IN LACTATED RINGERS INFUSION - SIMPLE MED: 62.5000 mL/h | INTRAVENOUS | Status: AC

## 2013-12-15 MED ORDER — OXYCODONE-ACETAMINOPHEN 5-325 MG PO TABS: 2.0000 | ORAL_TABLET | ORAL | Status: DC | PRN

## 2013-12-15 MED ORDER — PROMETHAZINE HCL 25 MG/ML IJ SOLN
INTRAMUSCULAR | Status: AC
  Filled 2013-12-15: qty 1

## 2013-12-15 SURGICAL SUPPLY — 31 items
BARRIER ADHS 3X4 INTERCEED (GAUZE/BANDAGES/DRESSINGS) ×3 IMPLANT
CLAMP CORD UMBIL (MISCELLANEOUS) IMPLANT
CONTAINER PREFILL 10% NBF 15ML (MISCELLANEOUS) IMPLANT
DRAPE SHEET LG 3/4 BI-LAMINATE (DRAPES) IMPLANT
DRSG OPSITE 11X17.75 LRG (GAUZE/BANDAGES/DRESSINGS) ×3 IMPLANT
DRSG OPSITE POSTOP 4X10 (GAUZE/BANDAGES/DRESSINGS) ×3 IMPLANT
DURAPREP 26ML APPLICATOR (WOUND CARE) ×3 IMPLANT
ELECT REM PT RETURN 9FT ADLT (ELECTROSURGICAL) ×3
ELECTRODE REM PT RTRN 9FT ADLT (ELECTROSURGICAL) ×1 IMPLANT
EXTRACTOR VACUUM KIWI (MISCELLANEOUS) ×3 IMPLANT
EXTRACTOR VACUUM M CUP 4 TUBE (SUCTIONS) IMPLANT
EXTRACTOR VACUUM M CUP 4' TUBE (SUCTIONS)
GLOVE BIOGEL PI IND STRL 6.5 (GLOVE) ×1 IMPLANT
GLOVE BIOGEL PI INDICATOR 6.5 (GLOVE) ×2
GLOVE SURG SS PI 6.0 STRL IVOR (GLOVE) ×3 IMPLANT
GOWN STRL REUS W/TWL LRG LVL3 (GOWN DISPOSABLE) ×6 IMPLANT
KIT ABG SYR 3ML LUER SLIP (SYRINGE) IMPLANT
NEEDLE HYPO 25X5/8 SAFETYGLIDE (NEEDLE) IMPLANT
NS IRRIG 1000ML POUR BTL (IV SOLUTION) ×3 IMPLANT
PACK C SECTION WH (CUSTOM PROCEDURE TRAY) ×3 IMPLANT
PAD OB MATERNITY 4.3X12.25 (PERSONAL CARE ITEMS) ×3 IMPLANT
RTRCTR C-SECT PINK 25CM LRG (MISCELLANEOUS) IMPLANT
SEPRAFILM MEMBRANE 5X6 (MISCELLANEOUS) IMPLANT
STAPLER VISISTAT 35W (STAPLE) IMPLANT
SUT PLAIN 0 NONE (SUTURE) IMPLANT
SUT PLAIN 2 0 XLH (SUTURE) ×3 IMPLANT
SUT VIC AB 0 CT1 36 (SUTURE) ×21 IMPLANT
SUT VIC AB 4-0 KS 27 (SUTURE) ×3 IMPLANT
TOWEL OR 17X24 6PK STRL BLUE (TOWEL DISPOSABLE) ×3 IMPLANT
TRAY FOLEY CATH 14FR (SET/KITS/TRAYS/PACK) ×3 IMPLANT
WATER STERILE IRR 1000ML POUR (IV SOLUTION) ×3 IMPLANT

## 2013-12-15 NOTE — Plan of Care (Signed)
Problem: Phase I Progression Outcomes Goal: Initial discharge plan identified Outcome: Completed/Met Date Met:  12/15/13

## 2013-12-15 NOTE — Progress Notes (Signed)
Patient seen this morning and wishes to proceed with elective repeat cesarean section with bilateral tubal ligation instead of continuing with induction of labor. Risks, benefits and alternatives were explained including but not limited to risks of bleeding, infection and damage to adjacent organs. Permanence of sterilization procedure also explained including 0.5-1% risks of failure. Patient verbalized understanding and all questions were answered.

## 2013-12-15 NOTE — Plan of Care (Signed)
Problem: Phase I Progression Outcomes Goal: OOB as tolerated unless otherwise ordered Outcome: Completed/Met Date Met:  12/15/13 Goal: IS, TCDB as ordered Outcome: Completed/Met Date Met:  12/15/13

## 2013-12-15 NOTE — Progress Notes (Signed)
Leslie ConroyKimberlee S Navarro is a 28 y.o. G2P1001 at 5181w2d by ultrasound admitted on 12/12/13 for induction of labor/TOLAC due to post dates.  Subjective: comfortable  Objective: BP 138/77 mmHg  Pulse 110  Temp(Src) 98.1 F (36.7 C) (Oral)  Resp 22  Ht 5\' 4"  (1.626 m)  Wt 293 lb (132.904 kg)  BMI 50.27 kg/m2  LMP 02/28/2013   FHT:  FHR: 155 bpm, variability: moderate,  accelerations:  Present,  decelerations:  Absent UC:   irregular, every 3 minutes SVE:   Dilation: 5 Effacement (%):  (65) Station: -2 Exam by:: acos ta md  Pitocin at 20 mu/min  Labs: Lab Results  Component Value Date   WBC 8.8 12/12/2013   HGB 11.4* 12/12/2013   HCT 34.8* 12/12/2013   MCV 74.4* 12/12/2013   PLT 135* 12/12/2013    Assessment / Plan: AROM attempted but unsuccessful by Dr. Alycia RossettiKoch.  Discussed possible elective cesarean section vs continuing pitocin, and another attempt at AROM.  Patient now desires to rest, stop pitocin and requests repeat elective cesarean section in morning.  NPO at midnight, stop pitocin, stop PCN  Leslie Krall ROCIO, MD 12/15/2013, 12:20 AM

## 2013-12-15 NOTE — Lactation Note (Signed)
This note was copied from the chart of Girl Mechele CollinKimberlee Latu. Lactation Consultation Note  Patient Name: Girl Mechele CollinKimberlee Defreitas ZOXWR'UToday's Date: 12/15/2013 Reason for consult: Initial assessment Baby 6 hours of life. Nursery RN Marcelino DusterMichelle just completed one touch with result of low blood glucose. Assisted mom to latch baby to right breast. While baby nursing, attempted to hand express colostrum from alternate/left breast. Mom has large, pendulous breast, and mom is very sleepy. Enc family to assist mom while nursing and holding baby STS as mom falls in and out of sleep quickly. Mom has colostrum, but not enough flowing to collect. Used hand pump to stimulate left breast while baby nursing on right breast. Discussed with mom that because she had low supply with first baby, she can use hand pump as soon as she wants for additional stimulation, along with hand massage and hand expression. Mom states that she has a DEBP coming to use at home. Discussed with mom that she can be set up with DEBP in hospital for additional stimulation as soon as she feels up to it. Discussed that post-pumping and stimulating both breasts at same time is best to increase supply. Moved baby and latched to left breast in football position. FOB will call out when finished nursing in order to notify nursery for next glucose check. Discussed with family the need to continue to offer STS. Discussed assessment and interventions [facility].   Maternal Data Has patient been taught Hand Expression?: Yes Does the patient have breastfeeding experience prior to this delivery?: Yes  Feeding Feeding Type: Breast Fed Length of feed: 25 min  LATCH Score/Interventions Latch: Grasps breast easily, tongue down, lips flanged, rhythmical sucking.  Audible Swallowing: A few with stimulation Intervention(s): Skin to skin;Alternate breast massage  Type of Nipple: Everted at rest and after stimulation  Comfort (Breast/Nipple): Soft  / non-tender     Hold (Positioning): Assistance needed to correctly position infant at breast and maintain latch. Intervention(s): Breastfeeding basics reviewed;Support Pillows;Position options  LATCH Score: 8  Lactation Tools Discussed/Used     Consult Status Consult Status: Follow-up Date: 12/16/13 Follow-up type: In-patient    Geralynn OchsWILLIARD, Terecia Plaut 12/15/2013, 4:27 PM

## 2013-12-15 NOTE — Anesthesia Procedure Notes (Signed)
Spinal Patient location during procedure: OR Start time: 12/15/2013 9:22 AM Staffing Anesthesiologist: Brayton CavesJACKSON, Janey Petron Performed by: anesthesiologist  Preanesthetic Checklist Completed: patient identified, site marked, surgical consent, pre-op evaluation, timeout performed, IV checked, risks and benefits discussed and monitors and equipment checked Spinal Block Patient position: sitting Prep: DuraPrep Patient monitoring: heart rate, cardiac monitor, continuous pulse ox and blood pressure Approach: midline Location: L3-4 Injection technique: single-shot Needle Needle type: Sprotte  Needle gauge: 24 G Needle length: 9 cm Assessment Sensory level: T4 Additional Notes Patient identified.  Risk benefits discussed including failed block, incomplete pain control, headache, nerve damage, paralysis, blood pressure changes, nausea, vomiting, reactions to medication both toxic or allergic, and postpartum back pain.  Patient expressed understanding and wished to proceed.  All questions were answered.  Sterile technique used throughout procedure.  CSF was clear.  No parasthesia or other complications.  Please see nursing notes for vital signs.

## 2013-12-15 NOTE — Op Note (Signed)
Leslie Navarro PROCEDURE DATE: 12/15/2013  PREOPERATIVE DIAGNOSIS: Intrauterine pregnancy at  391w3d weeks gestation; elective repeat cesarean section and desire for permanent sterilization  POSTOPERATIVE DIAGNOSIS: The same  PROCEDURE:     Cesarean Section and failed attempt at bilateral tubal ligation  SURGEON:  Dr. Catalina AntiguaPeggy Octavius Shin  ASSISTANT: Dr. Burnetta SabinKristi Navarro   INDICATIONS: Leslie Navarro is a 28 y.o. [id number] at 5891w3d scheduled for cesarean section secondary to elective repeat cesarean section.  The risks of cesarean section discussed with the patient included but were not limited to: bleeding which may require transfusion or reoperation; infection which may require antibiotics; injury to bowel, bladder, ureters or other surrounding organs; injury to the fetus; need for additional procedures including hysterectomy in the event of a life-threatening hemorrhage; placental abnormalities wth subsequent pregnancies, incisional problems, thromboembolic phenomenon and other postoperative/anesthesia complications. The patient concurred with the proposed plan, giving informed written consent for the procedure.    FINDINGS:  Viable female infant in cephalic presentation.  Apgars 8 and 9, weight, 9 pounds and 2 ounces.  Thin meconium stained amniotic fluid.  Intact placenta, three vessel cord.  Normal lower uterine segments, extensive adhesions to superior aspect of uterus and adnexal regions. Bilateral tubes and ovaries were not visualized.  ANESTHESIA:    Spinal INTRAVENOUS FLUIDS:2700 ml ESTIMATED BLOOD LOSS: 600 ml URINE OUTPUT:  100 ml SPECIMENS: Placenta sent to L&D COMPLICATIONS: None immediate  PROCEDURE IN DETAIL:  The patient received intravenous antibiotics and had sequential compression devices applied to her lower extremities while in the preoperative area.  She was then taken to the operating room where anesthesia was induced and was found to be adequate. A foley catheter was  placed into her bladder and attached to Leslie Navarro gravity. She was then placed in a dorsal supine position with a leftward tilt, and prepped and draped in a sterile manner. After an adequate timeout was performed, a Pfannenstiel skin incision was made with scalpel and carried through to the underlying layer of fascia. The fascia was incised in the midline and this incision was extended bilaterally using the Mayo scissors. Kocher clamps were applied to the superior aspect of the fascial incision and the underlying rectus muscles were dissected off bluntly. A similar process was carried out on the inferior aspect of the facial incision. The rectus muscles were separated in the midline bluntly and the peritoneum was entered bluntly. The Alexis self-retaining retractor was introduced into the abdominal cavity. Attention was turned to the lower uterine segment where a bladder flap was created, and a transverse hysterotomy was made with a scalpel and extended bilaterally bluntly. The infant was successfully delivered with the assistance of a Kiwi, and cord was clamped and cut and infant was handed over to awaiting neonatology team. Uterine massage was then administered and the placenta delivered intact with three-vessel cord. The uterus was cleared of clot and debris.  The hysterotomy was closed with 0 Vicryl in a running locked fashion, and an imbricating layer was also placed with a 0 Vicryl. Overall, excellent hemostasis was noted. Interceed was applied over the uterine incision. The pelvis copiously irrigated and cleared of all clot and debris. Hemostasis was confirmed on all surfaces. Due to extensive adhesions to the superior aspect of the uterus, it was not possible to feel or visualized the adnexal structures. The tubal ligation was therefore not performed. The peritoneum and the muscles were reapproximated using 0 vicryl interrupted stitches. The fascia was then closed using 0 Vicryl in a  running locked fashion.   The subcutaneous layer was reapproximated with plain gut and the skin was closed in a subcuticular fashion using 3.0 Vicryl. A Pico wound vac was applied to the incision. The patient tolerated the procedure well. Sponge, lap, instrument and needle counts were correct x 2. She was taken to the recovery room in stable condition.    Ercelle Winkles,PEGGYMD  12/15/2013 10:59 AM

## 2013-12-15 NOTE — Consult Note (Signed)
Neonatology Note:   Attendance at C-section:   I was asked by Dr. Constant to attend this repeat C/S at 41 3/7 weeks following failed induction for post-dates. The mother is a G2P1 O pos, GBS pos with an uncomplicated pregnancy. She has been on Pen G since 11/5 and has been afebrile throughout. ROM at delivery, fluid clear. Vacuum-assisted delivery. Infant vigorous with good spontaneous cry and tone. Needed only bulb suctioning. Ap 8/9. Lungs clear to ausc in DR. 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 To CN to care of Pediatrician.  Duncan Alejandro C. Perri Aragones, MD 

## 2013-12-15 NOTE — Plan of Care (Signed)
Problem: Phase II Progression Outcomes Goal: Pain controlled on oral analgesia Outcome: Completed/Met Date Met:  12/15/13 Goal: Progress activity as tolerated unless otherwise ordered Outcome: Completed/Met Date Met:  12/15/13 Goal: Afebrile, VS remain stable Outcome: Completed/Met Date Met:  12/15/13 Goal: Rh isoimmunization per orders Outcome: Not Applicable Date Met:  20/99/06 Goal: Tolerating diet Outcome: Completed/Met Date Met:  12/15/13

## 2013-12-15 NOTE — Plan of Care (Signed)
Problem: Phase I Progression Outcomes Goal: Pain controlled with appropriate interventions Outcome: Completed/Met Date Met:  12/15/13 Goal: Foley catheter patent Outcome: Completed/Met Date Met:  12/15/13 Goal: VS, stable, temp < 100.4 degrees F Outcome: Completed/Met Date Met:  12/15/13 Goal: Other Phase I Outcomes/Goals Outcome: Not Applicable Date Met:  20/99/06

## 2013-12-15 NOTE — Transfer of Care (Signed)
Immediate Anesthesia Transfer of Care Note  Patient: Leslie ConroyKimberlee S Melching  Procedure(s) Performed: Procedure(s): CESAREAN SECTION (N/A)  Patient Location: PACU  Anesthesia Type:Spinal  Level of Consciousness: awake and alert   Airway & Oxygen Therapy: Patient Spontanous Breathing  Post-op Assessment: Report given to PACU RN and Post -op Vital signs reviewed and stable  Post vital signs: Reviewed and stable  Complications: No apparent anesthesia complications

## 2013-12-15 NOTE — Anesthesia Preprocedure Evaluation (Signed)
Anesthesia Evaluation  Patient identified by MRN, date of birth, ID band Patient awake    Reviewed: Allergy & Precautions, H&P , NPO status , Patient's Chart, lab work & pertinent test results  Airway Mallampati: III      Dental   Pulmonary former smoker,  breath sounds clear to auscultation        Cardiovascular Exercise Tolerance: Good Rhythm:regular Rate:Normal     Neuro/Psych    GI/Hepatic   Endo/Other  Morbid obesity  Renal/GU      Musculoskeletal   Abdominal   Peds  Hematology   Anesthesia Other Findings   Reproductive/Obstetrics (+) Pregnancy                          Anesthesia Physical Anesthesia Plan  ASA: III  Anesthesia Plan: Spinal   Post-op Pain Management:    Induction:   Airway Management Planned:   Additional Equipment:   Intra-op Plan:   Post-operative Plan:   Informed Consent: I have reviewed the patients History and Physical, chart, labs and discussed the procedure including the risks, benefits and alternatives for the proposed anesthesia with the patient or authorized representative who has indicated his/her understanding and acceptance.     Plan Discussed with: Anesthesiologist, CRNA and Surgeon  Anesthesia Plan Comments:         Anesthesia Quick Evaluation  

## 2013-12-15 NOTE — Anesthesia Postprocedure Evaluation (Signed)
  Anesthesia Post Note  Patient: Earnest ConroyKimberlee S Read  Procedure(s) Performed: Procedure(s) (LRB): CESAREAN SECTION (N/A)  Anesthesia type: Spinal  Patient location: PACU  Post pain: Pain level controlled  Post assessment: Post-op Vital signs reviewed  Last Vitals:  Filed Vitals:   12/15/13 1145  BP: 124/54  Pulse: 95  Temp:   Resp: 20    Post vital signs: Reviewed  Level of consciousness: awake  Complications: No apparent anesthesia complications

## 2013-12-16 LAB — TYPE AND SCREEN
ABO/RH(D): O POS
ANTIBODY SCREEN: NEGATIVE
Unit division: 0
Unit division: 0

## 2013-12-16 MED ORDER — LORATADINE 10 MG PO TABS
10.0000 mg | ORAL_TABLET | Freq: Every day | ORAL | Status: DC
  Administered 2013-12-16 – 2013-12-17 (×2): 10 mg via ORAL
  Filled 2013-12-16 (×2): qty 1

## 2013-12-16 MED ORDER — PSEUDOEPHEDRINE HCL ER 120 MG PO TB12
120.0000 mg | ORAL_TABLET | Freq: Two times a day (BID) | ORAL | Status: DC
  Administered 2013-12-16 – 2013-12-17 (×2): 120 mg via ORAL
  Filled 2013-12-16 (×2): qty 1

## 2013-12-16 NOTE — Progress Notes (Addendum)
[id number] Rn called Dr. Alycia RossettiKoch regarding pt's incisional dressing. Pt's dressing is saturated with old drainage. The dressing type is a wound vac dressing . Dr. Alycia RossettiKoch said she would assess the dressing and decide whether to change dressing or not, at am rounding.

## 2013-12-16 NOTE — Progress Notes (Cosign Needed)
Post Partum Day 1 Subjective:  Leslie ConroyKimberlee S Navarro is a 28 y.o. [id number] 5934w3d s/p rLTCS.  No acute events overnight.  Pt denies problems with ambulating, voiding or po intake.  She denies nausea or vomiting.  Pain is well controlled.  She has had flatus. She has not had bowel movement.  Lochia Minimal.  Plan for birth control is undecided.  Method of Feeding: Br/Bo  Objective: Blood pressure 127/67, pulse 96, temperature 98.2 F (36.8 C), temperature source Oral, resp. rate 18, height 5\' 4"  (1.626 m), weight 132.904 kg (293 lb), last menstrual period 02/28/2013, SpO2 95 %, unknown if currently breastfeeding.  Physical Exam:  General: alert, cooperative and no distress Lochia:normal flow Chest: CTAB Heart: RRR no m/r/g Abdomen: +BS, soft, nontender,  Uterine Fundus: firm DVT Evaluation: No evidence of DVT seen on physical exam. Extremities: No edema   Recent Labs  12/15/13 0635  HGB 11.0*  HCT 34.2*    Assessment/Plan:  ASSESSMENT: Leslie ConroyKimberlee S Detjen is a 28 y.o. A5W0981G2P2002 9734w3d s/p rLTCS.  Plan for discharge tomorrow and Breastfeeding   LOS: 4 days   Aldona BarKoch, Vester Titsworth L 12/16/2013, 8:30 AM

## 2013-12-16 NOTE — Addendum Note (Signed)
Addendum  created 12/16/13 [id number] by Angela Adamana G Kaoru Rezendes, CRNA   Modules edited: Notes Section   Notes Section:  File: [id number]

## 2013-12-16 NOTE — Plan of Care (Signed)
Problem: Discharge Progression Outcomes Goal: Barriers To Progression Addressed/Resolved Outcome: Completed/Met Date Met:  12/16/13 Goal: Activity appropriate for discharge plan Outcome: Completed/Met Date Met:  12/16/13 Goal: Tolerating diet Outcome: Completed/Met Date Met:  73/22/56 Goal: Complications resolved/controlled Outcome: Completed/Met Date Met:  12/16/13 Goal: Pain controlled with appropriate interventions Outcome: Completed/Met Date Met:  12/16/13 Goal: Afebrile, VS remain stable at discharge Outcome: Completed/Met Date Met:  12/16/13

## 2013-12-16 NOTE — Anesthesia Postprocedure Evaluation (Signed)
  Anesthesia Post-op Note  Patient: Leslie Navarro  Procedure(s) Performed: Procedure(s): CESAREAN SECTION (N/A)  Patient Location: Mother/Baby  Anesthesia Type:Spinal  Level of Consciousness: awake and alert   Airway and Oxygen Therapy: Patient Spontanous Breathing  Post-op Pain: mild  Post-op Assessment: Post-op Vital signs reviewed, Patient's Cardiovascular Status Stable, Respiratory Function Stable, No signs of Nausea or vomiting, Pain level controlled, No headache, No residual numbness and No residual motor weakness  Post-op Vital Signs: Reviewed  Last Vitals:  Filed Vitals:   12/16/13 0350  BP: 127/67  Pulse: 96  Temp: 36.8 C  Resp: 18    Complications: No apparent anesthesia complications

## 2013-12-16 NOTE — Plan of Care (Signed)
Problem: Phase I Progression Outcomes Goal: Voiding adequately Outcome: Completed/Met Date Met:  12/16/13  Problem: Phase II Progression Outcomes Goal: Other Phase II Outcomes/Goals Outcome: Not Applicable Date Met:  10/71/25

## 2013-12-16 NOTE — Lactation Note (Signed)
This note was copied from the chart of Leslie Mechele CollinKimberlee Lumsden. Lactation Consultation Note  Patient Name: Leslie Navarro [id number]'VToday's Date: 12/16/2013 Reason for consult: Follow-up assessment Baby 30 hours of life. Baby asleep. Mom states baby nursed well earlier and she feels more confident latching baby now. Mom states that she is comfortable with NS as needed. [facility], but mom declined. Enc mom to call out for assistance with nursing/latching as needed. Enc mom to post-pump depending on how well the baby continues to nurse.   Maternal Data    Feeding Feeding Type:  (Baby sleeping, mom declined assistance with latching at this time, wanting to let baby sleep.) Length of feed: 20 min  LATCH Score/Interventions Latch: Grasps breast easily, tongue down, lips flanged, rhythmical sucking. Intervention(s): Adjust position;Assist with latch;Breast massage;Breast compression  Audible Swallowing: A few with stimulation Intervention(s): Hand expression;Alternate breast massage  Type of Nipple: Everted at rest and after stimulation  Comfort (Breast/Nipple): Soft / non-tender     Hold (Positioning): Assistance needed to correctly position infant at breast and maintain latch. Intervention(s): Breastfeeding basics reviewed;Support Pillows;Position options  LATCH Score: 8  Lactation Tools Discussed/Used Tools: Nipple Dorris CarnesShields;Pump Breast pump type: Double-Electric Breast Pump   Consult Status Consult Status: Follow-up Date: 12/17/13 Follow-up type: In-patient    Leslie Navarro, Leslie Navarro 12/16/2013, 4:49 PM

## 2013-12-16 NOTE — Lactation Note (Signed)
This note was copied from the chart of Leslie Navarro Carp. Lactation Consultation Note  Follow up visit made.  Mom has been giving bottle due to difficult latch.  She started using the DEBP today but not obtaining colostrum.  Assisted with positioning baby in football hold on right breast.  Breasts are large and good support needed.  Mom was able to express a small drop of colostrum before baby latched.  Breast tissue is compressible and nipples evert with stimulation.  Baby opened wide and latched easily with good breast compression.  Baby nursed actively for 10 minutes and when she came off mouth wet and colostrum expressed easily.  Assisted with latching baby to left breast.  Nipple inverts slightly with compression so baby started with 24 mm nipple shield for first five minutes.  Shield removed and baby latched easily and actively sucked.  Instructed to breastfeed with any feeding cue and to call for assist prn.  If baby acts very hungry after nursing mom may continue to supplement with small amounts of formula. Instructed to pump both breasts post feeding every 3 hours.  Patient Name: Leslie Navarro Luten RUEAV'WToday's Date: 12/16/2013 Reason for consult: Follow-up assessment;Difficult latch    Maternal Data    Feeding Feeding Type: Breast Fed Length of feed: 20 min  LATCH Score/Interventions Latch: Grasps breast easily, tongue down, lips flanged, rhythmical sucking. Intervention(s): Adjust position;Assist with latch;Breast massage;Breast compression  Audible Swallowing: A few with stimulation Intervention(s): Hand expression;Alternate breast massage  Type of Nipple: Everted at rest and after stimulation  Comfort (Breast/Nipple): Soft / non-tender     Hold (Positioning): Assistance needed to correctly position infant at breast and maintain latch. Intervention(s): Breastfeeding basics reviewed;Support Pillows;Position options  LATCH Score: 8  Lactation Tools Discussed/Used      Consult Status Consult Status: Follow-up Date: 12/17/13 Follow-up type: In-patient    Huston FoleyMOULDEN, [place] Kreitzer S 12/16/2013, 1:38 PM

## 2013-12-17 ENCOUNTER — Encounter (HOSPITAL_COMMUNITY): Payer: Self-pay | Admitting: Obstetrics and Gynecology

## 2013-12-17 MED ORDER — IBUPROFEN 600 MG PO TABS
600.0000 mg | ORAL_TABLET | Freq: Four times a day (QID) | ORAL | Status: DC | PRN
Start: 2013-12-17 — End: 2014-01-15

## 2013-12-17 MED ORDER — MEASLES, MUMPS & RUBELLA VAC ~~LOC~~ INJ
0.5000 mL | INJECTION | Freq: Once | SUBCUTANEOUS | Status: AC
  Administered 2013-12-17: 0.5 mL via SUBCUTANEOUS
  Filled 2013-12-17 (×2): qty 0.5

## 2013-12-17 MED ORDER — OXYCODONE-ACETAMINOPHEN 5-325 MG PO TABS: 2.0000 | ORAL_TABLET | ORAL | Status: DC | PRN

## 2013-12-17 NOTE — Lactation Note (Signed)
This note was copied from the chart of Leslie Mechele CollinKimberlee Sledge. Lactation Consultation Note  Baby sleeping in crib with pacifier in mouth. Recently breastfed 20 min and given 20 ml of formula. Encouraged mother to breastfeed on both breasts, burping in between before giving any formula. Parents have volume guidelines. Reviewed supply, demand and engorgement care. Discussed reasons to wait on using a pacifier. Mother denies soreness and is not using NS to latch baby anymore. Suggest she call lactation if she needs further assistance.  Patient Name: Leslie Navarro YQMVH'QToday's Date: 12/17/2013 Reason for consult: Follow-up assessment   Maternal Data    Feeding Feeding Type: Breast Milk Length of feed: 20 min  LATCH Score/Interventions Latch: Repeated attempts needed to sustain latch, nipple held in mouth throughout feeding, stimulation needed to elicit sucking reflex.                    Lactation Tools Discussed/Used     Consult Status Consult Status: Complete    Hardie PulleyBerkelhammer, Deryn Massengale Boschen 12/17/2013, 9:21 AM

## 2013-12-17 NOTE — Plan of Care (Signed)
Problem: Consults Goal: Postpartum Patient Education (See Patient Education module for education specifics.)  Outcome: Completed/Met Date Met:  12/17/13 Goal: Skin Care Protocol Initiated - if Braden Score 18 or less If consults are not indicated, leave blank or document N/A  Outcome: Not Applicable Date Met:  12/17/13 Goal: Nutrition Consult-if indicated Outcome: Completed/Met Date Met:  12/17/13 Goal: Diabetes Guidelines if Diabetic/Glucose > 140 If diabetic or lab glucose is > 140 mg/dl - Initiate Diabetes/Hyperglycemia Guidelines & Document Interventions  Outcome: Not Applicable Date Met:  12/17/13  Problem: Phase II Progression Outcomes Goal: Incision intact & without signs/symptoms of infection Outcome: Completed/Met Date Met:  12/17/13  Problem: Discharge Progression Outcomes Goal: Remove staples per MD order Outcome: Not Applicable Date Met:  12/17/13 Goal: MMR given as ordered Outcome: Completed/Met Date Met:  12/17/13 Goal: Discharge plan in place and appropriate Outcome: Completed/Met Date Met:  12/17/13 Goal: Other Discharge Outcomes/Goals Outcome: Completed/Met Date Met:  12/17/13     

## 2013-12-17 NOTE — Discharge Summary (Signed)
Obstetric Discharge Summary Reason for Admission: cesarean section Prenatal Procedures: ultrasound Intrapartum Procedures: cesarean: low cervical, transverse Postpartum Procedures: none Complications-Operative and Postpartum: none and failed BTL HEMOGLOBIN  Date Value Ref Range Status  12/15/2013 11.0* 12.0 - 15.0 g/dL Final   HCT  Date Value Ref Range Status  12/15/2013 34.2* 36.0 - 46.0 % Final    Physical Exam:  General: alert, cooperative, appears stated age and no distress Lochia: appropriate Uterine Fundus: firm Incision: healing well, no significant drainage, no dehiscence, no significant erythema DVT Evaluation: No evidence of DVT seen on physical exam. Negative Homan's sign. No cords or calf tenderness. No significant calf/ankle edema.  Discharge Diagnoses: Term Pregnancy-delivered  Discharge Information: Date: 12/17/2013 Activity: pelvic rest Diet: routine Medications: PNV, Ibuprofen and Percocet Condition: stable and improved Instructions: refer to practice specific booklet Discharge to: home   Newborn Data: Live born female  Birth Weight: 9 lb 2 oz (4139 g) APGAR: 8, 9  Home with mother.  LAWSON, MARIE DARLENE 12/17/2013, 5:12 AM

## 2013-12-19 ENCOUNTER — Inpatient Hospital Stay (HOSPITAL_COMMUNITY): Admission: AD | Admit: 2013-12-19 | Payer: Self-pay | Source: Ambulatory Visit | Admitting: Obstetrics & Gynecology

## 2013-12-21 ENCOUNTER — Other Ambulatory Visit: Payer: 59 | Admitting: *Deleted

## 2013-12-24 ENCOUNTER — Encounter: Payer: Self-pay | Admitting: Obstetrics & Gynecology

## 2013-12-24 ENCOUNTER — Ambulatory Visit (INDEPENDENT_AMBULATORY_CARE_PROVIDER_SITE_OTHER): Payer: 59 | Admitting: Obstetrics & Gynecology

## 2013-12-24 VITALS — BP 122/70 | HR 97 | Wt 267.0 lb

## 2013-12-24 DIAGNOSIS — O900 Disruption of cesarean delivery wound: Secondary | ICD-10-CM | POA: Insufficient documentation

## 2013-12-24 MED ORDER — SULFAMETHOXAZOLE-TRIMETHOPRIM 800-160 MG PO TABS: 1.0000 | ORAL_TABLET | Freq: Two times a day (BID) | ORAL | Status: DC

## 2013-12-24 NOTE — Patient Instructions (Signed)
Wound Dehiscence Wound dehiscence is when a surgical cut (incision) opens up and does not heal properly. It usually happens 7-10 days after surgery. You may have bleeding from the cut. You may also have pain or a fever. This condition should be treated early. HOME CARE  Only take medicines as told by your doctor.  Take your antibiotic medicine as told. Finish it even if you start to feel better.  Wash your wound when you shower. Pat the wound dry. Do not rub the wound.  Change bandages (dressings) as often as told. Wash your hands before and after changing bandages. Apply bandages as told.  Take showers. Do not soak the wound, bathe, swim, or use a hot tub until directed by your doctor.  Avoid exercises that make you sweat.  Use medicines that stop itching as told by your doctor. The wound may itch as it heals. Do not pick or scratch at the wound.  Do not lift more than 15 pounds until the wound is healed, or as told by your doctor.  Keep all doctor visits as told. GET HELP IF:  You have a lot of bleeding from your surgical cut.  Your wound does not seem to be healing right.  You have a fever. GET HELP RIGHT AWAY IF:  You have more puffiness (swelling) or redness around the wound.  You have more pain in the wound.  You have yellowish white fluid (pus) coming from the wound.  More of the wound breaks open. MAKE SURE YOU:  Understand these instructions.  Will watch your condition.  Will get help right away if you are not doing well or get worse. Document Released: 01/13/2009 Document Revised: 06/11/2013 Document Reviewed: 10/02/2012 [facility] Patient Information 2015 [place], [place]. This information is not intended to replace advice given to you by your health care provider. Make sure you discuss any questions you have with your health care provider.

## 2013-12-24 NOTE — Progress Notes (Signed)
   CLINIC ENCOUNTER NOTE  History:  28 y.o. [id number] here today for incision check after noted separation of cesarean wound with drainage; procedure was on 12/15/13 and PICO negative pressure wound dresing was placed.  This was removed on 12/21/13 and wound separation with serous drainage was noted by RN.  Steristrips were placed with benzoin.  Patient reports they fell off the next day, and thus returned today for reevaluation.  The following portions of the patient's history were reviewed and updated as appropriate: allergies, current medications, past family history, past medical history, past social history, past surgical history and problem list. Normal pap on 04/23/13.    Review of Systems:  Pertinent items are noted in HPI.  Objective:  Physical Exam BP 122/70 mmHg  Pulse 97  Wt 267 lb (121.11 kg)  Breastfeeding? Yes Gen: NAD Abd: Soft, obese, nontender and nondistended.  Incision: 3-4 cm region in middle of incision with separation, subcuticular sutures in place. Significant serous drainage noted upon expression.  The separation goes down to level of fascia, no purulent material seen. Fascia intact upon probing with cotton swab. No erythema, no induration.  4 x 4 placed loosely on incision to help with drainage. Pelvic: Deferred  Assessment & Plan:  Cesarean wound separation - Patient instructed to keep area clean and dry and use guaze loosely to help with picking up drainage.  Bactrim DS given for infection prophylaxis Will reevaluate in one week.   Jaynie CollinsUGONNA  ANYANWU, MD, FACOG Attending Obstetrician & Gynecologist Center for [facility], [facility]

## 2013-12-24 NOTE — Progress Notes (Signed)
Wound vac removed and sutures were seen so I asked Dr. Shawnie PonsPratt to evaluate.  Dr. Shawnie PonsPratt examined incision and placed benzoin and steri strips over wound. Advised patient to follow up next week to have incision checked.  She will use a pad to absorb any excess moisture.  Patient understands.

## 2013-12-26 ENCOUNTER — Ambulatory Visit: Payer: 59 | Admitting: Obstetrics & Gynecology

## 2013-12-31 ENCOUNTER — Ambulatory Visit (INDEPENDENT_AMBULATORY_CARE_PROVIDER_SITE_OTHER): Payer: 59 | Admitting: Obstetrics & Gynecology

## 2013-12-31 ENCOUNTER — Encounter: Payer: Self-pay | Admitting: Obstetrics & Gynecology

## 2013-12-31 VITALS — BP 120/87 | HR 92 | Ht 64.0 in | Wt 251.8 lb

## 2013-12-31 DIAGNOSIS — O900 Disruption of cesarean delivery wound: Secondary | ICD-10-CM

## 2013-12-31 DIAGNOSIS — Z9889 Other specified postprocedural states: Secondary | ICD-10-CM

## 2013-12-31 DIAGNOSIS — Z98891 History of uterine scar from previous surgery: Secondary | ICD-10-CM

## 2013-12-31 NOTE — Progress Notes (Signed)
   CLINIC ENCOUNTER NOTE  History:  28 y.o. [id number] here today for incision check after noted separation of cesarean wound with drainage; last check was on 12/24/13.  Procedure was on 12/15/13 and PICO negative pressure wound dresing was placed. This was removed on 12/21/13 and wound separation with serous drainage was noted by RN. Steristrips were placed with benzoin. Patient reports they fell off the next day, and thus returned today for reevaluation.  The following portions of the patient's history were reviewed and updated as appropriate: allergies, current medications, past family history, past medical history, past social history, past surgical history and problem list. Normal pap on 04/23/13.   Review of Systems:  Pertinent items are noted in HPI.  Objective:  Physical Exam Blood pressure 120/87, pulse 92, height 5\' 4"  (1.626 m), weight 251 lb 12.8 oz (114.216 kg), currently breastfeeding. Gen: NAD Abd: Soft, obese, nontender and nondistended.  Incision: 3-4 cm region in middle of incision with separation, subcuticular sutures in place. Much improved from last week, the separation is only superficial (3 -5 mm) this week compared to last week when it was down to fascial layer.  Minimal drainage.  No erythema, no induration. Pelvic: Deferred  Assessment & Plan:  Cesarean wound separation, significant improvement in one week. Patient instructed to continue keep area clean and dry and use guaze loosely to help with picking up drainage. Will reevaluate in two weeks during formal PP check. Of note, surgical order sent in for laparoscopic bilateral sterilization given failed attempt during cesarean section.   Jaynie CollinsUGONNA  Jessenya Berdan, MD, FACOG Attending Obstetrician & Gynecologist Center for [facility], [facility]

## 2013-12-31 NOTE — Patient Instructions (Signed)
Sterilization Information, Female Female sterilization is a procedure to permanently prevent pregnancy. There are different ways to perform sterilization, but all either block or close the fallopian tubes so that your eggs cannot reach your uterus. If your egg cannot reach your uterus, sperm cannot fertilize the egg, and you cannot get pregnant.  Sterilization is performed by a surgical procedure. Sometimes these procedures are performed in a hospital while a patient is asleep. Sometimes they can be done in a clinic setting with the patient awake. The fallopian tubes can be surgically cut, tied, or sealed through a procedure called tubal ligation. The fallopian tubes can also be closed with clips or rings. Sterilization can also be done by placing a tiny coil into each fallopian tube, which causes scar tissue to grow inside the tube. The scar tissue then blocks the tubes.  Discuss sterilization with your caregiver to answer any concerns you or your partner may have. You may want to ask what type of sterilization your caregiver performs. Some caregivers may not perform all the various options. Sterilization is permanent and should only be done if you are sure you do not want children or do not want any more children. Having a sterilization reversed may not be successful.  STERILIZATION PROCEDURES  Laparoscopic sterilization. This is a surgical method performed at a time other than right after childbirth. Two incisions are made in the lower abdomen. A thin, lighted tube (laparoscope) is inserted into one of the incisions and is used to perform the procedure. The fallopian tubes are closed with a ring or a clip. An instrument that uses heat could be used to seal the tubes closed (electrocautery).   Mini-laparotomy. This is a surgical method done 1 or 2 days after giving birth. Typically, a small incision is made just below the belly button (umbilicus) and the fallopian tubes are exposed. The tubes can then be  sealed, tied, or cut.   Hysteroscopic sterilization. This is performed at a time other than right after childbirth. A tiny, spring-like coil is inserted through the cervix and uterus and placed into the fallopian tubes. The coil causes scaring and blocks the tubes. Other forms of contraception should be used for 3 months after the procedure to allow the scar tissue to form completely. Additionally, it is required hysterosalpingography be done 3 months later to ensure that the procedure was successful. Hysterosalpingography is a procedure that uses X-rays to look at your uterus and fallopian tubes after a material to make them show up better has been inserted.  Salpingectomy. The tubes are totally removed. Done laparoscopically. IS STERILIZATION SAFE? Sterilization is considered safe with very rare complications. Risks depend on the type of procedure you have. As with any surgical procedure, there are risks. Some risks of sterilization by any means include:   Bleeding.  Infection.  Reaction to anesthesia medicine.  Injury to surrounding organs. Risks specific to having hysteroscopic coils placed include:  The coils may not be placed correctly the first time.   The coils may move out of place.   The tubes may not get completely blocked after 3 months.   Injury to surrounding organs when placing the coil.  HOW EFFECTIVE IS FEMALE STERILIZATION? Sterilization is nearly 100% effective, but it can fail. Depending on the type of sterilization, the rate of failure can be as high as 3%. After hysteroscopic sterilization with placement of fallopian tube coils, you will need back-up birth control for 3 months after the procedure. Sterilization is effective for  a lifetime.  BENEFITS OF STERILIZATION  It does not affect your hormones, and therefore will not affect your menstrual periods, sexual desire, or performance.   It is effective for a lifetime.   It is safe.   You do not need to  worry about getting pregnant. Keep in mind that if you had the hysteroscopic placement procedure, you must wait 3 months after the procedure (or until your caregiver confirms) before pregnancy is not considered possible.   There are no side effects unlike other types of birth control (contraception).  DRAWBACKS OF STERILIZATION  You must be sure you do not want children or any more children. The procedure is permanent.   It does not provide protection against sexually transmitted infections (STIs).   The tubes can grow back together. If this happens, there is a risk of pregnancy. There is also an increased risk (50%) of pregnancy being an ectopic pregnancy. This is a pregnancy that happens outside of the uterus. Document Released: 07/14/2007 Document Revised: 01/30/2013 Document Reviewed: 05/13/2011 [facility] Patient Information 2015 [place], [place]. This information is not intended to replace advice given to you by your health care provider. Make sure you discuss any questions you have with your health care provider.

## 2014-01-15 ENCOUNTER — Ambulatory Visit (INDEPENDENT_AMBULATORY_CARE_PROVIDER_SITE_OTHER): Payer: 59 | Admitting: Family Medicine

## 2014-01-15 ENCOUNTER — Encounter: Payer: Self-pay | Admitting: Family Medicine

## 2014-01-15 VITALS — BP 117/80 | HR 77 | Ht 64.0 in | Wt 259.0 lb

## 2014-01-15 DIAGNOSIS — O900 Disruption of cesarean delivery wound: Secondary | ICD-10-CM

## 2014-01-15 DIAGNOSIS — Z30011 Encounter for initial prescription of contraceptive pills: Secondary | ICD-10-CM

## 2014-01-15 MED ORDER — NORETHINDRONE 0.35 MG PO TABS: 1.0000 | ORAL_TABLET | Freq: Every day | ORAL | Status: DC

## 2014-01-15 NOTE — Patient Instructions (Signed)
Laparoscopic Tubal Ligation Laparoscopic tubal ligation is a procedure that closes the fallopian tubes at a time other than right after childbirth. By closing the fallopian tubes, the eggs that are released from the ovaries cannot enter the uterus and sperm cannot reach the egg. Tubal ligation is also known as getting your "tubes tied." Tubal ligation is done so you will not be able to get pregnant or have a baby.  Although this procedure may be reversed, it should be considered permanent and irreversible. If you want to have future pregnancies, you should not have this procedure.  LET YOUR CAREGIVER KNOW ABOUT:  Allergies to food or medicine.  Medicines taken, including vitamins, herbs, eyedrops, over-the-counter medicines, and creams.  Use of steroids (by mouth or creams).  Previous problems with numbing medicines.  History of bleeding problems or blood clots.  Any recent colds or infections.  Previous surgery.  Other health problems, including diabetes and kidney problems.  Possibility of pregnancy, if this applies.  Any past pregnancies. RISKS AND COMPLICATIONS   Infection.  Bleeding.  Injury to surrounding organs.  Anesthetic side effects.  Failure of the procedure.  Ectopic pregnancy.  Future regret about having the procedure done. BEFORE THE PROCEDURE  Do not take aspirin or blood thinners a week before the procedure or as directed. This can cause bleeding.  Do not eat or drink anything 6 to 8 hours before the procedure. PROCEDURE   You may be given a medicine to help you relax (sedative) before the procedure. You will be given a medicine to make you sleep (general anesthetic) during the procedure.  A tube will be put down your throat to help your breath while under general anesthesia.  Two small cuts (incisions) are made in the lower abdominal area and near the belly button.  Your abdominal area will be inflated with a safe gas (carbon dioxide). This helps  give the surgeon room to operate, visualize, and helps the surgeon avoid other organs.  A thin, lighted tube (laparoscope) with a camera attached is inserted into your abdomen through one of the incisions near the belly button. Other small instruments are also inserted through the other abdominal incision.  The fallopian tubes are located and are either blocked with a ring, clip, or are burned (cauterized).  After the fallopian tubes are blocked, the gas is released from the abdomen.  The incisions will be closed with stitches (sutures), and a bandage may be placed over the incisions. AFTER THE PROCEDURE   You will rest in a recovery room for 1--4 hours until you are stable and doing well.  You will also have some mild abdominal discomfort for 3--7 days. You will be given pain medicine to ease any discomfort.  As long as there are no problems, you may be allowed to go home. Someone will need to drive you home and be with you for at least 24 hours once home.  You may have some mild discomfort in the throat. This is from the tube placed in your throat while you were sleeping.  You may experience discomfort in the shoulder area from some trapped air between the liver and diaphragm. This sensation is normal and will slowly go away on its own. Document Released: 05/03/2000 Document Revised: 07/27/2011 Document Reviewed: 05/08/2011 ExitCare Patient Information 2015 Exit[employer], [employer]. This information is not intended to replace advice given to you by your health care provider. Make sure you discuss any questions you have with your health care provider.  

## 2014-01-15 NOTE — Progress Notes (Signed)
  Subjective:     Leslie ConroyKimberlee S Bither is a 28 y.o. female who presents for a postpartum visit. She is 5 weeks postpartum following a low cervical transverse Cesarean section. I have fully reviewed the prenatal and intrapartum course. The delivery was at 39 gestational weeks. Outcome: repeat cesarean section, low transverse incision. Anesthesia: spinal. Postpartum course has been unremarkable. Baby's course has been normal. Baby is feeding by breast. Bleeding staining only. Bowel function is normal. Bladder function is normal. Patient is not sexually active. Contraception method is none. Postpartum depression screening: negative. Has been seen for separation of scar from C-section.  The following portions of the patient's history were reviewed and updated as appropriate: allergies, current medications, past family history, past medical history, past social history, past surgical history and problem list.  Review of Systems Pertinent items are noted in HPI.   Objective:    BP 117/80 mmHg  Pulse 77  Ht 5\' 4"  (1.626 m)  Wt 259 lb (117.482 kg)  BMI 44.44 kg/m2  Breastfeeding? Yes  General:  alert  Abdomen: soft, non-tender; bowel sounds normal; no masses,  no organomegaly and incision is open about 2.5 cm--treated with AgNO3   Vulva:  normal  Vagina: normal vagina  Cervix:  multiparous appearance and no lesions  Corpus: normal size, contour, position, consistency, mobility, non-tender  Adnexa:  normal adnexa        Assessment:     Normal postpartum exam. Pap smear not done at today's visit.   Plan:    1. Contraception: oral progesterone-only contraceptive 2. Schedule for BTL 3. Follow up in: 1 year or as needed.

## 2014-02-07 ENCOUNTER — Encounter (HOSPITAL_COMMUNITY)
Admission: RE | Admit: 2014-02-07 | Discharge: 2014-02-07 | Disposition: A | Discharge status: Home / Self Care | Payer: 59 | Source: Ambulatory Visit | Attending: Obstetrics & Gynecology | Admitting: Obstetrics & Gynecology

## 2014-02-07 ENCOUNTER — Encounter (HOSPITAL_COMMUNITY): Payer: Self-pay

## 2014-02-07 DIAGNOSIS — Z01812 Encounter for preprocedural laboratory examination: Secondary | ICD-10-CM | POA: Insufficient documentation

## 2014-02-07 LAB — CBC
HEMATOCRIT: 38.4 % (ref 36.0–46.0)
HEMOGLOBIN: 12.4 g/dL (ref 12.0–15.0)
MCH: 24 pg — ABNORMAL LOW (ref 26.0–34.0)
MCHC: 32.3 g/dL (ref 30.0–36.0)
MCV: 74.3 fL — ABNORMAL LOW (ref 78.0–100.0)
Platelets: 241 10*3/uL (ref 150–400)
RBC: 5.17 MIL/uL — AB (ref 3.87–5.11)
RDW: 15.1 % (ref 11.5–15.5)
WBC: 8 10*3/uL (ref 4.0–10.5)

## 2014-02-07 NOTE — Patient Instructions (Signed)
Your procedure is scheduled on:02/19/14  Enter through the Main Entrance at :1130 am Pick up desk phone and dial [phone] and inform us of your arrival.  Please call [phone] if you have any problems the morning of surgery.  Remember: Do not eat food after midnight:Monday 02/18/14 Clear liquids are ok until:9am on 02/19/14   You may brush your teeth the morning of surgery.   DO NOT wear jewelry, eye make-up, lipstick,body lotion, or dark fingernail polish.  (Polished toes are ok) You may wear deodorant.  If you are to be admitted after surgery, leave suitcase in car until your room has been assigned. Patients discharged on the day of surgery will not be allowed to drive home. Wear loose fitting, comfortable clothes for your ride home.

## 2014-02-19 ENCOUNTER — Ambulatory Visit (HOSPITAL_COMMUNITY): Payer: 59 | Admitting: Anesthesiology

## 2014-02-19 ENCOUNTER — Encounter (HOSPITAL_COMMUNITY)
Admission: RE | Disposition: A | Discharge status: Home / Self Care | Payer: Self-pay | Source: Ambulatory Visit | Attending: Obstetrics & Gynecology

## 2014-02-19 ENCOUNTER — Ambulatory Visit (HOSPITAL_COMMUNITY)
Admission: RE | Admit: 2014-02-19 | Discharge: 2014-02-19 | Disposition: A | Discharge status: Home / Self Care | Payer: 59 | Source: Ambulatory Visit | Attending: Obstetrics & Gynecology | Admitting: Obstetrics & Gynecology

## 2014-02-19 ENCOUNTER — Encounter (HOSPITAL_COMMUNITY): Payer: Self-pay

## 2014-02-19 DIAGNOSIS — Z87891 Personal history of nicotine dependence: Secondary | ICD-10-CM | POA: Insufficient documentation

## 2014-02-19 DIAGNOSIS — Z302 Encounter for sterilization: Secondary | ICD-10-CM | POA: Insufficient documentation

## 2014-02-19 DIAGNOSIS — Z6841 Body Mass Index (BMI) 40.0 and over, adult: Secondary | ICD-10-CM | POA: Diagnosis not present

## 2014-02-19 DIAGNOSIS — Z3009 Encounter for other general counseling and advice on contraception: Secondary | ICD-10-CM | POA: Insufficient documentation

## 2014-02-19 HISTORY — PX: LAPAROSCOPIC TUBAL LIGATION: SHX1937

## 2014-02-19 LAB — PREGNANCY, URINE: Preg Test, Ur: NEGATIVE

## 2014-02-19 SURGERY — LIGATION, FALLOPIAN TUBE, LAPAROSCOPIC
Anesthesia: General | Site: Abdomen | Laterality: Bilateral

## 2014-02-19 MED ORDER — GLYCOPYRROLATE 0.2 MG/ML IJ SOLN
INTRAMUSCULAR | Status: DC | PRN
  Administered 2014-02-19: 0.6 mg via INTRAVENOUS

## 2014-02-19 MED ORDER — DEXAMETHASONE SODIUM PHOSPHATE 10 MG/ML IJ SOLN
INTRAMUSCULAR | Status: DC | PRN
  Administered 2014-02-19: 4 mg via INTRAVENOUS

## 2014-02-19 MED ORDER — LACTATED RINGERS IV SOLN: INTRAVENOUS | Status: DC

## 2014-02-19 MED ORDER — PROMETHAZINE HCL 25 MG/ML IJ SOLN
6.2500 mg | INTRAMUSCULAR | Status: DC | PRN
Start: 2014-02-19 — End: 2014-02-19

## 2014-02-19 MED ORDER — LIDOCAINE HCL (CARDIAC) 20 MG/ML IV SOLN
INTRAVENOUS | Status: DC | PRN
  Administered 2014-02-19: 80 mg via INTRAVENOUS

## 2014-02-19 MED ORDER — MIDAZOLAM HCL 2 MG/2ML IJ SOLN
INTRAMUSCULAR | Status: DC | PRN
  Administered 2014-02-19: 2 mg via INTRAVENOUS

## 2014-02-19 MED ORDER — MIDAZOLAM HCL 2 MG/2ML IJ SOLN: 0.5000 mg | Freq: Once | INTRAMUSCULAR | Status: DC | PRN

## 2014-02-19 MED ORDER — DOCUSATE SODIUM 100 MG PO CAPS: 100.0000 mg | ORAL_CAPSULE | Freq: Two times a day (BID) | ORAL | Status: DC | PRN

## 2014-02-19 MED ORDER — DEXAMETHASONE SODIUM PHOSPHATE 10 MG/ML IJ SOLN
INTRAMUSCULAR | Status: AC
  Filled 2014-02-19: qty 1

## 2014-02-19 MED ORDER — MEPERIDINE HCL 25 MG/ML IJ SOLN: 6.2500 mg | INTRAMUSCULAR | Status: DC | PRN

## 2014-02-19 MED ORDER — FENTANYL CITRATE 0.05 MG/ML IJ SOLN
INTRAMUSCULAR | Status: AC
  Administered 2014-02-19: 50 ug via INTRAVENOUS
  Filled 2014-02-19: qty 2

## 2014-02-19 MED ORDER — ONDANSETRON HCL 4 MG/2ML IJ SOLN
INTRAMUSCULAR | Status: DC | PRN
  Administered 2014-02-19: 4 mg via INTRAVENOUS

## 2014-02-19 MED ORDER — KETOROLAC TROMETHAMINE 30 MG/ML IJ SOLN
INTRAMUSCULAR | Status: AC
  Filled 2014-02-19: qty 1

## 2014-02-19 MED ORDER — ROCURONIUM BROMIDE 100 MG/10ML IV SOLN
INTRAVENOUS | Status: DC | PRN
  Administered 2014-02-19: 30 mg via INTRAVENOUS

## 2014-02-19 MED ORDER — FENTANYL CITRATE 0.05 MG/ML IJ SOLN
INTRAMUSCULAR | Status: AC
  Filled 2014-02-19: qty 5

## 2014-02-19 MED ORDER — ACETAMINOPHEN 325 MG PO TABS: 325.0000 mg | ORAL_TABLET | ORAL | Status: DC | PRN

## 2014-02-19 MED ORDER — IBUPROFEN 600 MG PO TABS: 600.0000 mg | ORAL_TABLET | Freq: Four times a day (QID) | ORAL | Status: DC | PRN

## 2014-02-19 MED ORDER — LACTATED RINGERS IV SOLN
INTRAVENOUS | Status: DC
  Administered 2014-02-19 (×2): via INTRAVENOUS

## 2014-02-19 MED ORDER — KETOROLAC TROMETHAMINE 30 MG/ML IJ SOLN: 30.0000 mg | Freq: Once | INTRAMUSCULAR | Status: DC | PRN

## 2014-02-19 MED ORDER — NEOSTIGMINE METHYLSULFATE 10 MG/10ML IV SOLN
INTRAVENOUS | Status: DC | PRN
  Administered 2014-02-19: 3 mg via INTRAVENOUS

## 2014-02-19 MED ORDER — ACETAMINOPHEN 160 MG/5ML PO SOLN: 325.0000 mg | ORAL | Status: DC | PRN

## 2014-02-19 MED ORDER — CEFAZOLIN SODIUM-DEXTROSE 2-3 GM-% IV SOLR
2.0000 g | INTRAVENOUS | Status: AC
  Administered 2014-02-19: 2 g via INTRAVENOUS

## 2014-02-19 MED ORDER — FENTANYL CITRATE 0.05 MG/ML IJ SOLN
INTRAMUSCULAR | Status: AC
  Filled 2014-02-19: qty 2

## 2014-02-19 MED ORDER — LIDOCAINE HCL (CARDIAC) 20 MG/ML IV SOLN
INTRAVENOUS | Status: AC
  Filled 2014-02-19: qty 5

## 2014-02-19 MED ORDER — KETOROLAC TROMETHAMINE 30 MG/ML IJ SOLN
INTRAMUSCULAR | Status: DC | PRN
  Administered 2014-02-19: 30 mg via INTRAVENOUS

## 2014-02-19 MED ORDER — GLYCOPYRROLATE 0.2 MG/ML IJ SOLN
INTRAMUSCULAR | Status: AC
  Filled 2014-02-19: qty 3

## 2014-02-19 MED ORDER — FENTANYL CITRATE 0.05 MG/ML IJ SOLN
INTRAMUSCULAR | Status: DC | PRN
  Administered 2014-02-19 (×3): 50 ug via INTRAVENOUS
  Administered 2014-02-19: 100 ug via INTRAVENOUS

## 2014-02-19 MED ORDER — PROPOFOL 10 MG/ML IV BOLUS
INTRAVENOUS | Status: AC
  Filled 2014-02-19: qty 20

## 2014-02-19 MED ORDER — LABETALOL HCL 5 MG/ML IV SOLN
INTRAVENOUS | Status: DC | PRN
  Administered 2014-02-19 (×4): 5 mg via INTRAVENOUS

## 2014-02-19 MED ORDER — LABETALOL HCL 5 MG/ML IV SOLN
INTRAVENOUS | Status: AC
  Filled 2014-02-19: qty 4

## 2014-02-19 MED ORDER — 0.9 % SODIUM CHLORIDE (POUR BTL) OPTIME
TOPICAL | Status: DC | PRN
  Administered 2014-02-19: 1000 mL

## 2014-02-19 MED ORDER — ROCURONIUM BROMIDE 100 MG/10ML IV SOLN
INTRAVENOUS | Status: AC
  Filled 2014-02-19: qty 1

## 2014-02-19 MED ORDER — PROPOFOL 10 MG/ML IV BOLUS
INTRAVENOUS | Status: DC | PRN
  Administered 2014-02-19: 200 mg via INTRAVENOUS

## 2014-02-19 MED ORDER — BUPIVACAINE HCL (PF) 0.5 % IJ SOLN
INTRAMUSCULAR | Status: AC
  Filled 2014-02-19: qty 30

## 2014-02-19 MED ORDER — NEOSTIGMINE METHYLSULFATE 10 MG/10ML IV SOLN
INTRAVENOUS | Status: AC
  Filled 2014-02-19: qty 1

## 2014-02-19 MED ORDER — CEFAZOLIN SODIUM-DEXTROSE 2-3 GM-% IV SOLR
INTRAVENOUS | Status: AC
  Filled 2014-02-19: qty 50

## 2014-02-19 MED ORDER — FENTANYL CITRATE 0.05 MG/ML IJ SOLN
25.0000 ug | INTRAMUSCULAR | Status: DC | PRN
  Administered 2014-02-19: 50 ug via INTRAVENOUS

## 2014-02-19 MED ORDER — OXYCODONE-ACETAMINOPHEN 5-325 MG PO TABS: 1.0000 | ORAL_TABLET | Freq: Four times a day (QID) | ORAL | Status: DC | PRN

## 2014-02-19 MED ORDER — SCOPOLAMINE 1 MG/3DAYS TD PT72
MEDICATED_PATCH | TRANSDERMAL | Status: AC
  Administered 2014-02-19: 1.5 mg via TRANSDERMAL
  Filled 2014-02-19: qty 1

## 2014-02-19 MED ORDER — ONDANSETRON HCL 4 MG/2ML IJ SOLN
INTRAMUSCULAR | Status: AC
  Filled 2014-02-19: qty 2

## 2014-02-19 MED ORDER — MIDAZOLAM HCL 2 MG/2ML IJ SOLN
INTRAMUSCULAR | Status: AC
Start: 2014-02-19 — End: 2014-02-19
  Filled 2014-02-19: qty 2

## 2014-02-19 MED ORDER — SCOPOLAMINE 1 MG/3DAYS TD PT72
1.0000 | MEDICATED_PATCH | Freq: Once | TRANSDERMAL | Status: DC
  Administered 2014-02-19: 1.5 mg via TRANSDERMAL

## 2014-02-19 SURGICAL SUPPLY — 21 items
CATH ROBINSON RED A/P 16FR (CATHETERS) ×3 IMPLANT
CLIP FILSHIE TUBAL LIGA STRL (Clip) ×3 IMPLANT
CLOTH BEACON ORANGE TIMEOUT ST (SAFETY) ×3 IMPLANT
DRSG COVADERM PLUS 2X2 (GAUZE/BANDAGES/DRESSINGS) ×3 IMPLANT
DRSG OPSITE POSTOP 3X4 (GAUZE/BANDAGES/DRESSINGS) ×3 IMPLANT
DURAPREP 26ML APPLICATOR (WOUND CARE) ×3 IMPLANT
GLOVE BIOGEL PI IND STRL 7.0 (GLOVE) ×1 IMPLANT
GLOVE BIOGEL PI INDICATOR 7.0 (GLOVE) ×2
GLOVE ECLIPSE 7.0 STRL STRAW (GLOVE) ×3 IMPLANT
GOWN STRL REUS W/TWL LRG LVL3 (GOWN DISPOSABLE) ×6 IMPLANT
NEEDLE HYPO 22GX1.5 SAFETY (NEEDLE) ×3 IMPLANT
NEEDLE INSUFFLATION 120MM (ENDOMECHANICALS) ×3 IMPLANT
PACK LAPAROSCOPY BASIN (CUSTOM PROCEDURE TRAY) ×3 IMPLANT
PAD POSITIONER PINK NONSTERILE (MISCELLANEOUS) ×3 IMPLANT
SUT VIC AB 3-0 X1 27 (SUTURE) ×3 IMPLANT
SUT VICRYL 0 UR6 27IN ABS (SUTURE) ×6 IMPLANT
SYR 30ML LL (SYRINGE) ×3 IMPLANT
TOWEL OR 17X24 6PK STRL BLUE (TOWEL DISPOSABLE) ×6 IMPLANT
TROCAR XCEL NON-BLD 11X100MML (ENDOMECHANICALS) ×3 IMPLANT
TROCAR XCEL NON-BLD 5MMX100MML (ENDOMECHANICALS) ×3 IMPLANT
WATER STERILE IRR 1000ML POUR (IV SOLUTION) ×3 IMPLANT

## 2014-02-19 NOTE — Discharge Instructions (Addendum)
Laparoscopic Surgery - Care After Laparoscopy is a surgical procedure. It is used to diagnose and treat diseases inside the belly(abdomen). It is usually a brief, common, and relatively simple procedure. The laparoscopeis a thin, lighted, pencil-sized instrument. It is like a telescope. It is inserted into your abdomen through a small cut (incision). Your caregiver can look at the organs inside your body through this instrument.  She can see if there is anything abnormal. Laparoscopy can be done either in a hospital or outpatient clinic. You may be given a mild sedative to help you relax before the procedure. Once in the operating room, you will be given a drug to make you sleep (general anesthesia). Laparoscopy usually lasts about 1 hour. After the procedure, you will be monitored in a recovery area until you are stable and doing well. Once you are home, it may take 3 to 7 days to fully recover.  Laparoscopy has relatively few risks. Your caregiver will discuss the risks with you before the procedure. Some problems that can occur include: RISKS AND COMPLICATIONS  Allergies to medicines. Difficulty breathing. Bleeding. Infection. Damage to other surrounding structures HOME CARE INSTRUCTIONS  Infection. Bleeding. Damage to other organs. Anesthetic side effects.  Need for additional procedures such as open procedures/laparotomy PROCEDURE Once you receive anesthesia, your surgeon inflates the abdomen with a harmless gas (carbon dioxide). This makes the organs easier to see. The laparoscope is inserted into the abdomen through a small incision. This allows your surgeon to see into the abdomen. Other small instruments are also inserted into the abdomen through other small openings. Many surgeons attach a video camera to the laparoscope to enlarge the view. During a laparoscopy, the surgeon may be looking for inflammation, infection, or cancer.  The surgeon may also need to take out certain organs or  take tissue samples (biopsies). The specimens are sent to a specialist in looking at cells and tissue samples (pathologist). The pathologist examines them under a microscope to help to diagnose or confirm a disease. AFTER THE PROCEDURE  The incisions are closed with stitches (sutures) and Dermabond. Because these incisions are small (usually less than 1/2 inch), there is usually minimal discomfort after the procedure. There may also be discomfort from the instrument placement incisions in the abdomen. You will be given pain medicine to ease any discomfort. You will rest in a recovery room for 1-2 hours until you are stable and doing well. You may have some mild discomfort in the throat. This is from the tube placed in your throat while you were sleeping. You may experience discomfort in the shoulder area from some trapped air between the liver and diaphragm. This sensation is normal and will slowly go away on its own. The recovery time is shortened as long as there are no complications. You will rest in a recovery room until stable and doing well. As long as there are no complications, you may be allowed to go home. Someone will need to drive you home and be with you for at least 24 hours once home. FINDING OUT THE RESULTS You will be called with the results of the pathology and will discuss these results with  your caregiver during your postoperative appointment. Do not assume everything is normal if you have not heard from your caregiver or the medical facility. It is important for you to follow up on all of your results. HOME CARE INSTRUCTIONS  Take all medicines as directed. Only take over-the-counter or prescription medicines for pain, discomfort,   CARE INSTRUCTIONS   Take all medicines as directed.  Only take over-the-counter or prescription medicines for pain, discomfort, or fever as directed by your caregiver.  Resume daily activities as directed.  Showers are preferred over baths.  You may resume sexual activities in 1 week or as directed.  Do not drive while taking  narcotics. SEEK MEDICAL CARE IF:  There is increasing abdominal pain.  You feel lightheaded or faint.  You have the chills.  You have an oral temperature above 102 F (38.9 C).  There is pus-like (purulent) drainage from any of the wounds.  You are unable to pass gas or have a bowel movement.  You feel sick to your stomach (nauseous) or throw up (vomit). MAKE SURE YOU:   Understand these instructions.  Will watch your condition.  Will get help right away if you are not doing well or get worse.  ExitCare Patient Information 2013 [employer] [place]  ; DISCHARGE INSTRUCTIONS: Laparoscopy  The following instructions have been prepared to help you care for yourself upon your return home today.  MAY TAKE IBUPROFEN (MOTRIN, ADVIL) OR ALEVE AFTER 8:00  PM FOR PAIN.    SCOPE PATCH BEHIND EAR CAN BE REMOVED ON THURSDAY   Wound care:  Do not get the incision wet for the first 24 hours. The incision should be kept clean and dry.  The Band-Aids or dressings may be removed the day after surgery.  Should the incision become sore, red, and swollen after the first week, check with your doctor.  Personal hygiene:  Shower the day after your procedure.  Activity and limitations:  Do NOT drive or operate any equipment today.  Do NOT lift anything more than 15 pounds for 2-3 weeks after surgery.  Do NOT rest in bed all day.  Walking is encouraged. Walk each day, starting slowly with 5-minute walks 3 or 4 times a day. Slowly increase the length of your walks.  Walk up and down stairs slowly.  Do NOT do strenuous activities, such as golfing, playing tennis, bowling, running, biking, weight lifting, gardening, mowing, or vacuuming for 2-4 weeks. Ask your doctor when it is okay to start.  Diet: Eat a light meal as desired this evening. You may resume your usual diet tomorrow.  Return to work: This is dependent on the type of work you do. For the most part you can return to a desk  job within a week of surgery. If you are more active at work, please discuss this with your doctor.  What to expect after your surgery: You may have a slight burning sensation when you urinate on the first day. You may have a very small amount of blood in the urine. Expect to have a small amount of vaginal discharge/light bleeding for 1-2 weeks. It is not unusual to have abdominal soreness and bruising for up to 2 weeks. You may be tired and need more rest for about 1 week. You may experience shoulder pain for 24-72 hours. Lying flat in bed may relieve it.  Call your doctor for any of the following:  Develop a fever of 100.4 or greater  Inability to urinate 6 hours after discharge from hospital  Severe pain not relieved by pain medications  Persistent of heavy bleeding at incision site  Redness or swelling around incision site after a week  Increasing nausea or vomiting  Patient Signature________________________________________ Nurse Signature_________________________________________

## 2014-02-19 NOTE — Anesthesia Postprocedure Evaluation (Signed)
  Anesthesia Post-op Note  Patient: Leslie Navarro  Procedure(s) Performed: Procedure(s) with comments: LAPAROSCOPIC TUBAL LIGATION (Bilateral) - Possible lysis of adhesions  Patient Location: PACU  Anesthesia Type:General  Level of Consciousness: awake, alert  and oriented  Airway and Oxygen Therapy: Patient Spontanous Breathing  Post-op Pain: mild  Post-op Assessment: Post-op Vital signs reviewed, Patient's Cardiovascular Status Stable, Respiratory Function Stable, Patent Airway, No signs of Nausea or vomiting and Pain level controlled  Post-op Vital Signs: Reviewed and stable  Last Vitals:  Filed Vitals:   02/19/14 1500  BP: 135/72  Pulse: 64  Temp:   Resp: 22    Complications: No apparent anesthesia complications

## 2014-02-19 NOTE — Anesthesia Procedure Notes (Signed)
Procedure Name: Intubation Date/Time: 02/19/2014 1:06 PM Performed by: Rossie MuskratHUNT, Chrystie Hagwood L Pre-anesthesia Checklist: Patient identified, Patient being monitored, Timeout performed, Emergency Drugs available and Suction available Patient Re-evaluated:Patient Re-evaluated prior to inductionOxygen Delivery Method: Circle system utilized Preoxygenation: Pre-oxygenation with 100% oxygen Intubation Type: IV induction Ventilation: Mask ventilation without difficulty Laryngoscope Size: Mac and 3 Grade View: Grade I Tube type: Oral Tube size: 7.0 mm Number of attempts: 1 Airway Equipment and Method: Stylet Placement Confirmation: ETT inserted through vocal cords under direct vision,  breath sounds checked- equal and bilateral and positive ETCO2 Secured at: 20 cm Tube secured with: Tape Dental Injury: Teeth and Oropharynx as per pre-operative assessment

## 2014-02-19 NOTE — H&P (Signed)
Preoperative History and Physical  Leslie ConroyKimberlee S Navarro is a 29 y.o. [id number] here for surgical management of undesired fertility.  She underwent a cesarean section on 12/15/13 and a concurrent BTS was unable to be performed due to adhesions involving upper portion of the uterus and adnexa.  The plan was made for interval BTS.   No significant preoperative concerns.  Proposed surgery: Laparoscopic bilateral tubal sterilization using Filshie clips,  possible lysis of adhesions, possible laparoscopic bilateral salpingectomy  Past Medical History  Diagnosis Date  . Medical history non-contributory    Past Surgical History  Procedure Laterality Date  . Tonsillectomy    . Cesarean section    . Cesarean section N/A 12/15/2013    Procedure: CESAREAN SECTION;  Surgeon: Catalina AntiguaPeggy Constant, MD;  Location: [facility];  Service: Obstetrics;  Laterality: N/A;   OB History  Gravida Para Term Preterm AB SAB TAB Ectopic Multiple Living  2 2 2  0 0 0 0 0 0 2    # Outcome Date GA Lbr Len/2nd Weight Sex Delivery Anes PTL Lv  2 Term 12/15/13 211w3d  9 lb 2 oz (4.139 kg) F CS-Vac Spinal  Y  1 Term 11/19/04 775w0d  8 lb 7 oz (3.827 kg) M CS-LTranv Spinal  Y     Comments: failure to progress     Patient denies any other pertinent gynecologic issues.   No current facility-administered medications on file prior to encounter.   Current Outpatient Prescriptions on File Prior to Encounter  Medication Sig Dispense Refill  . Prenatal Vit-Fe Fumarate-FA (MULTIVITAMIN-PRENATAL) 27-0.8 MG TABS tablet Take 1 tablet by mouth daily at 12 noon.    . Misc. Devices (BREAST PUMP) MISC 1 Units by Does not apply route 5 (five) times daily as needed. 1 each 0   No Known Allergies   Social History:   reports that she quit smoking about 2 years ago. Her smoking use included Cigarettes. She smoked 0.00 packs per day. She has never used smokeless tobacco. She reports that she drinks alcohol. She reports that she does not use illicit  drugs.  Family History  Problem Relation Age of Onset  . Diabetes Maternal Grandfather   . Cancer Paternal Grandfather 4060    colon    Review of Systems: Noncontributory  PHYSICAL EXAM: Temp 37.0 C BP 136/80 P86 SaO2 97% General appearance - alert, well appearing, and in no distress Chest - clear to auscultation, no wheezes, rales or rhonchi, symmetric air entry Heart - normal rate and regular rhythm Abdomen - soft, nontender, nondistended, no masses or organomegaly, well-healed cesarean section scar Pelvic - examination not indicated Extremities - peripheral pulses normal, no pedal edema, no clubbing or cyanosis  Labs: Results for orders placed or performed during the hospital encounter of 02/07/14 (from the past 336 hour(s))  CBC   Collection Time: 02/07/14  4:17 PM  Result Value Ref Range   WBC 8.0 4.0 - 10.5 K/uL   RBC 5.17 (H) 3.87 - 5.11 MIL/uL   Hemoglobin 12.4 12.0 - 15.0 g/dL   HCT 45.438.4 09.836.0 - 11.946.0 %   MCV 74.3 (L) 78.0 - 100.0 fL   MCH 24.0 (L) 26.0 - 34.0 pg   MCHC 32.3 30.0 - 36.0 g/dL   RDW 14.715.1 82.911.5 - 56.215.5 %   Platelets 241 150 - 400 K/uL    Assessment and Plan: Patient will undergo surgical management with laparoscopic bilateral tubal sterilization using Filshie clips, possible laparoscopic bilateral salpingectomy with possible lysis of adhesions.  The risks of surgery were discussed in detail with the patient including but not limited to: Risks of procedure discussed with patient including permanence of method, risk of regret, bleeding, infection, injury to surrounding organs,  need for additional procedures including laparotomy,  surgical site problems and other postoperative/anesthesia complications.  Failure risk less than 1% with increased risk of ectopic gestation if pregnancy occurs was also discussed with patient.  Likelihood of success in alleviating the patient's condition was discussed.  Routine postoperative instructions will be reviewed with the  patient and her family in detail after surgery.  The patient concurred with the proposed plan, giving informed written consent for the surgery.  Patient has been NPO since last night she will remain NPO for procedure.  Anesthesia and OR aware.  Preoperative prophylactic antibiotics and SCDs ordered on call to the OR.  To OR when ready.  Jaynie Collins, M.D. 02/19/2014 11:27 AM

## 2014-02-19 NOTE — Op Note (Signed)
Leslie ConroyKimberlee S Navarro 02/19/2014  PREOPERATIVE DIAGNOSIS:  Undesired fertility  POSTOPERATIVE DIAGNOSIS:  Undesired fertility  PROCEDURE:  Laparoscopic Bilateral Tubal Sterilization using Filshie Clips   SURGEON: Jaynie CollinsUgonna Anyanwu, MD  ANESTHESIA:  General endotracheal  COMPLICATIONS:  None immediate.  ESTIMATED BLOOD LOSS:  Less than 10 ml.  FLUIDS: 1600 ml LR  INDICATIONS: 29 y.o. [id number]  with undesired fertility, desires permanent sterilization. Other reversible forms of contraception were discussed with patient; she declines all other modalities.  Risks of procedure discussed with patient including permanence of method, bleeding, infection, injury to surrounding organs and need for additional procedures including laparotomy, risk of regret.  Failure risk of 0.5-1% with increased risk of ectopic gestation if pregnancy occurs was also discussed with patient.      FINDINGS:  Normal uterus, tubes, and ovaries. Large adhesion involving the top of uterus and the anterior abdominal wall.  Also thick omental adhesions involving anterior abdominal wall and lateral sides of the uterus/adnexa, but the fallopian tubes were able to be visualized bilaterally and Filshie clips placed on isthmic regions bilaterally.   TECHNIQUE:  The patient was taken to the operating room where general anesthesia was obtained without difficulty.  She was then placed in the dorsal lithotomy position and prepared and draped in sterile fashion.  After an adequate timeout was performed, a bivalved speculum was then placed in the patient's vagina, and the anterior lip of cervix grasped with the single-tooth tenaculum.  The uterine manipulator was then advanced into the uterus.  The speculum was removed from the vagina.  Attention was then turned to the patient's abdomen where a 5-mm skin incision was made in the left upper quadrant.  The Optiview 5-mm trocar and sleeve were then advanced without difficulty with the laparoscope  under direct visualization into the abdomen.  The abdomen was then insufflated with carbon dioxide gas and adequate pneumoperitoneum was obtained.  A survey of the patient's pelvis and abdomen revealed the findings above.  A 10-mm right lower quadrant port was then placed under direct visualization.  The fallopian tubes were observed, followed out to fimbriated ends and found to be normal in appearance despite the surrounding adhesions. The Filshie clip applicator was placed through the 10-mm port, and a Filshie clip was placed on the right fallopian tube, about 2 cm from the cornual attachment, with care given to incorporate the underlying mesosalpinx.  A similar process was carried out on the contralateral side allowing for bilateral tubal sterilization.   Good hemostasis was noted overall.  The instruments were then removed from the patient's abdomen and the 10-mm fascial incision was repaired with 0 Vicryl.  The skin incisions were closed with 3-0 Vicryl subcuticular stitches.  The uterine manipulator was removed from the vagina without complications. The patient tolerated the procedure well.  Sponge, lap, and needle counts were correct times two.  The patient was then taken to the recovery room awake, extubated and in stable condition.  The patient will be discharged to home as per PACU criteria.  Routine postoperative instructions given.  She was prescribed Percocet, Ibuprofen and Colace.  She will follow up in the clinic on 03/28/14 for postoperative evaluation.   Jaynie CollinsUGONNA  ANYANWU, MD, FACOG Attending Obstetrician & Gynecologist Faculty Practice, [facility] [place]

## 2014-02-19 NOTE — Transfer of Care (Signed)
Immediate Anesthesia Transfer of Care Note  Patient: Leslie ConroyKimberlee S Navarro  Procedure(s) Performed: Procedure(s) with comments: LAPAROSCOPIC TUBAL LIGATION (Bilateral) - Possible lysis of adhesions  Patient Location: PACU  Anesthesia Type:General  Level of Consciousness: awake, alert  and oriented  Airway & Oxygen Therapy: Patient Spontanous Breathing and Patient connected to nasal cannula oxygen  Post-op Assessment: Report given to PACU RN and Post -op Vital signs reviewed and stable  Post vital signs: Reviewed and stable  Complications: No apparent anesthesia complications

## 2014-02-19 NOTE — Anesthesia Preprocedure Evaluation (Signed)
Anesthesia Evaluation  Patient identified by MRN, date of birth, ID band Patient awake    Reviewed: Allergy & Precautions, H&P , Patient's Chart, lab work & pertinent test results, reviewed documented beta blocker date and time   History of Anesthesia Complications Negative for: history of anesthetic complications  Airway Mallampati: III  TM Distance: >3 FB Neck ROM: full    Dental   Pulmonary former smoker,  breath sounds clear to auscultation        Cardiovascular Exercise Tolerance: Good Rhythm:regular Rate:Normal     Neuro/Psych    GI/Hepatic   Endo/Other  Morbid obesity  Renal/GU      Musculoskeletal   Abdominal   Peds  Hematology   Anesthesia Other Findings   Reproductive/Obstetrics                             Anesthesia Physical Anesthesia Plan  ASA: III  Anesthesia Plan: General ETT   Post-op Pain Management:    Induction:   Airway Management Planned:   Additional Equipment:   Intra-op Plan:   Post-operative Plan:   Informed Consent: I have reviewed the patients History and Physical, chart, labs and discussed the procedure including the risks, benefits and alternatives for the proposed anesthesia with the patient or authorized representative who has indicated his/her understanding and acceptance.   Dental Advisory Given  Plan Discussed with: CRNA and Surgeon  Anesthesia Plan Comments:         Anesthesia Quick Evaluation

## 2014-02-21 ENCOUNTER — Encounter (HOSPITAL_COMMUNITY): Payer: Self-pay | Admitting: Obstetrics & Gynecology

## 2014-03-20 ENCOUNTER — Ambulatory Visit: Payer: Self-pay | Admitting: Family Medicine

## 2014-03-28 ENCOUNTER — Ambulatory Visit (INDEPENDENT_AMBULATORY_CARE_PROVIDER_SITE_OTHER): Payer: 59 | Admitting: Obstetrics & Gynecology

## 2014-03-28 ENCOUNTER — Encounter: Payer: Self-pay | Admitting: Obstetrics & Gynecology

## 2014-03-28 VITALS — BP 113/81 | HR 96 | Temp 98.5°F | Ht 64.0 in | Wt 254.9 lb

## 2014-03-28 DIAGNOSIS — Z9889 Other specified postprocedural states: Secondary | ICD-10-CM

## 2014-03-28 DIAGNOSIS — Z09 Encounter for follow-up examination after completed treatment for conditions other than malignant neoplasm: Secondary | ICD-10-CM

## 2014-03-28 NOTE — Patient Instructions (Signed)
Return to clinic for any scheduled appointments or for any gynecologic concerns as needed.   

## 2014-03-28 NOTE — Progress Notes (Signed)
   CLINIC ENCOUNTER NOTE  History:  29 y.o. [id number] here today for postoperative follow up after Laparoscopic Bilateral Tubal Sterilization using Filshie Clips on 02/19/14  The following portions of the patient's history were reviewed and updated as appropriate: allergies, current medications, past family history, past medical history, past social history, past surgical history and problem list. Normal pap on 04/23/13.    Review of Systems:  Pertinent items are noted in HPI.  Objective:  Physical Exam BP 113/81 mmHg  Pulse 96  Temp(Src) 98.5 F (36.9 C) (Oral)  Ht 5\' 4"  (1.626 m)  Wt 254 lb 14.4 oz (115.622 kg)  BMI 43.73 kg/m2  LMP 03/23/2014  Breastfeeding? No Gen: NAD Abd: Soft, nontender and nondistended, well healed laparoscopic sites. Pelvic: Deferred  Assessment & Plan:  Normal postoperative check Routine preventative health maintenance measures emphasized.   Jaynie CollinsUGONNA  Eliza Green, MD, FACOG Attending Obstetrician & Gynecologist Center for [facility], [facility]

## 2014-04-03 ENCOUNTER — Encounter: Payer: Self-pay | Admitting: *Deleted

## 2016-09-08 IMAGING — US US OB FOLLOW-UP
1 series · 12 of 28 positions shown · non-contrast
Comparison: none

[Series 1: eval growth, elev bp in office · 0.26mm/px · 12 of 42 slices shown]
[im 2/42]
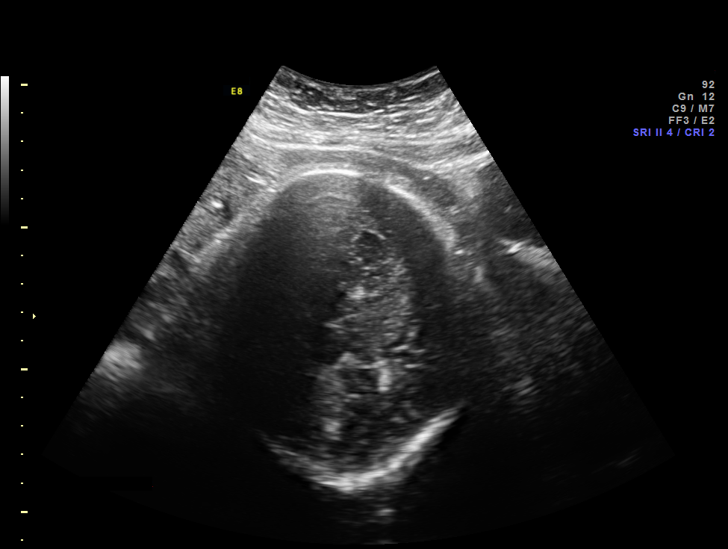
[im 5/42]
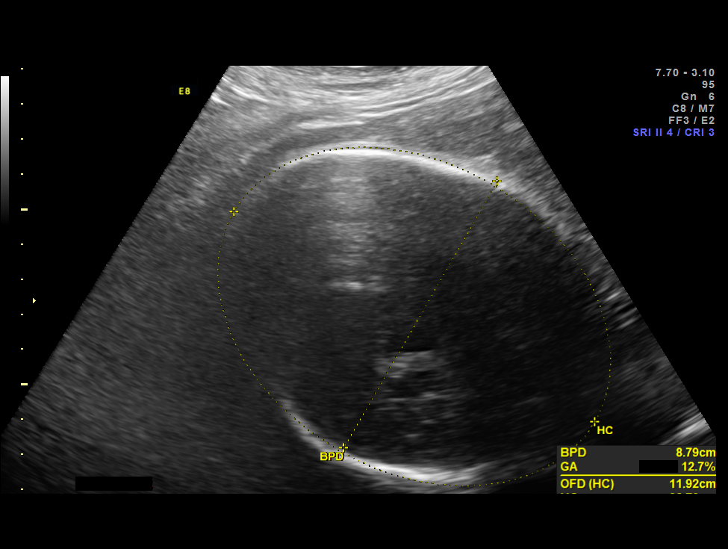
[im 8/42]
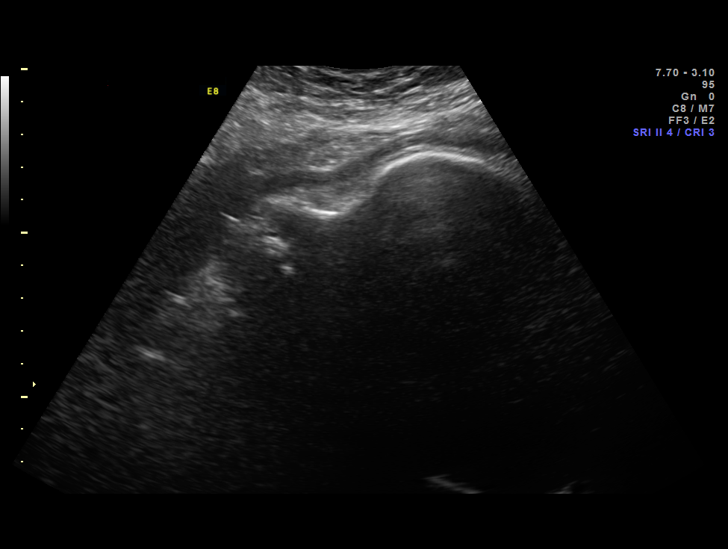
[im 13/42]
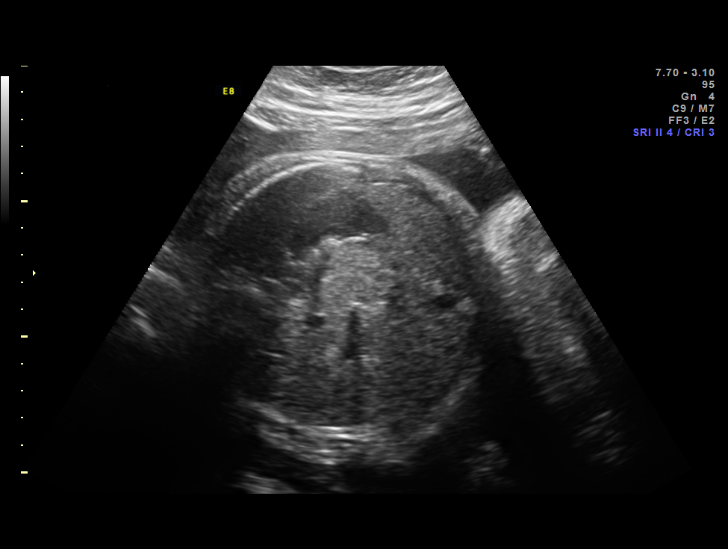
[im 16/42]
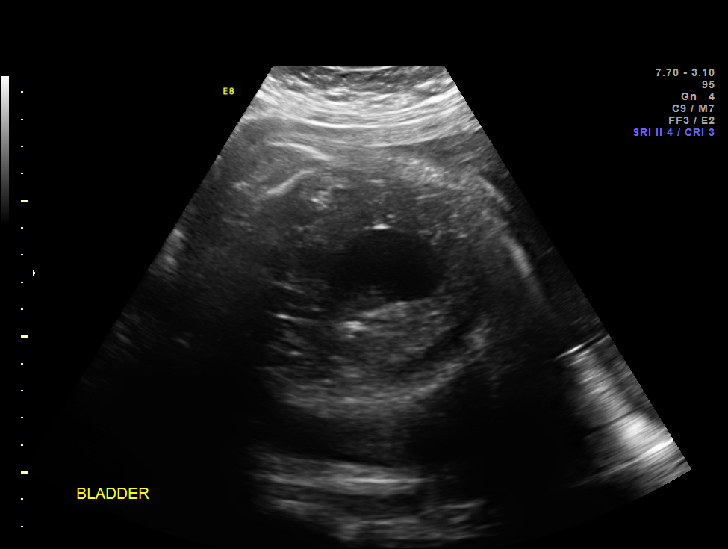
[im 19/42]
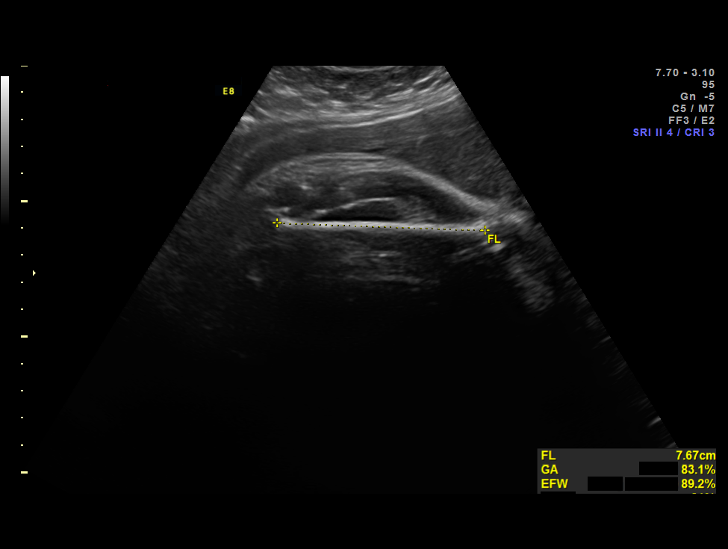
[im 23/42]
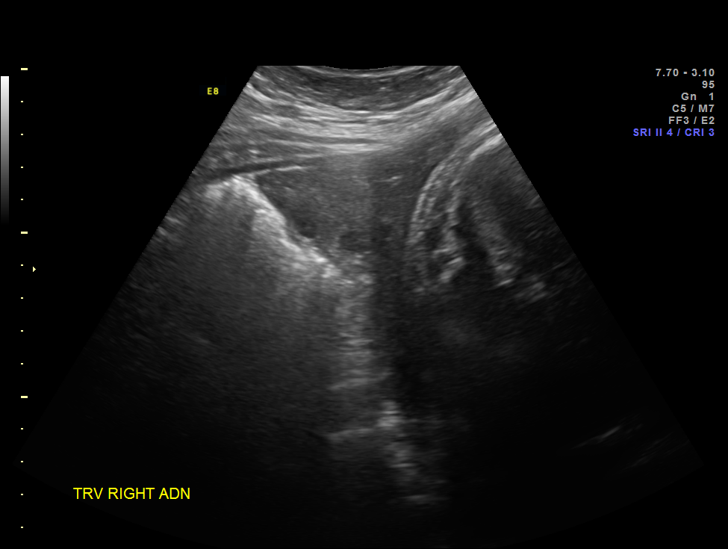
[im 26/42]
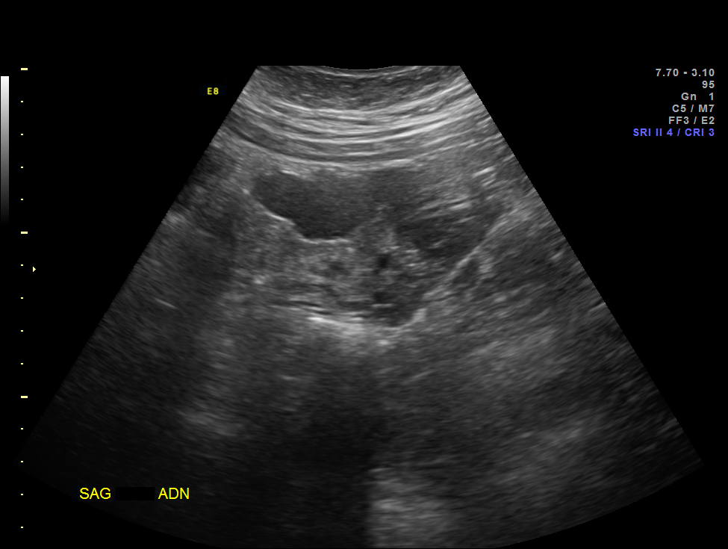
[im 29/42]
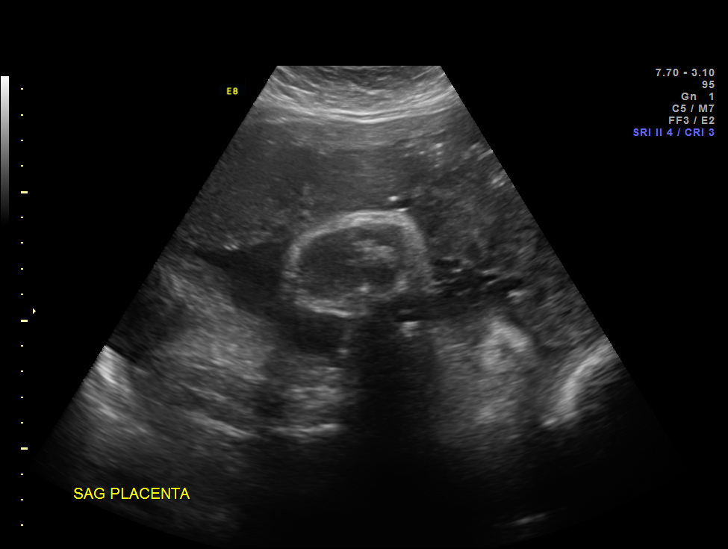
[im 34/42]
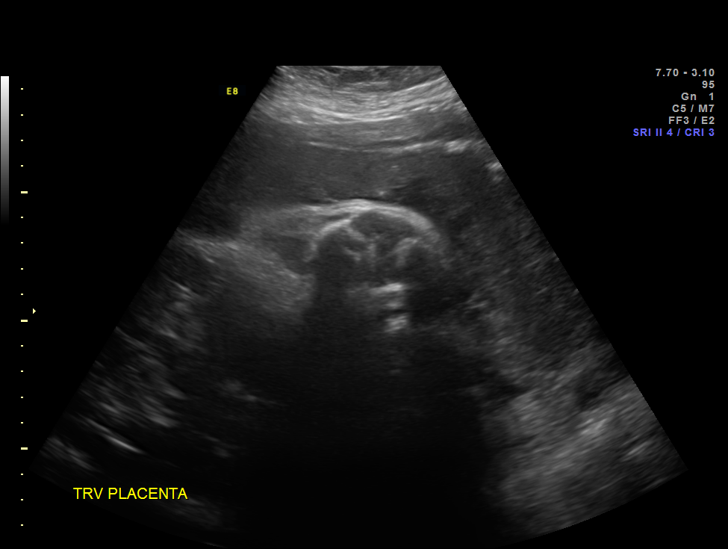
[im 37/42]
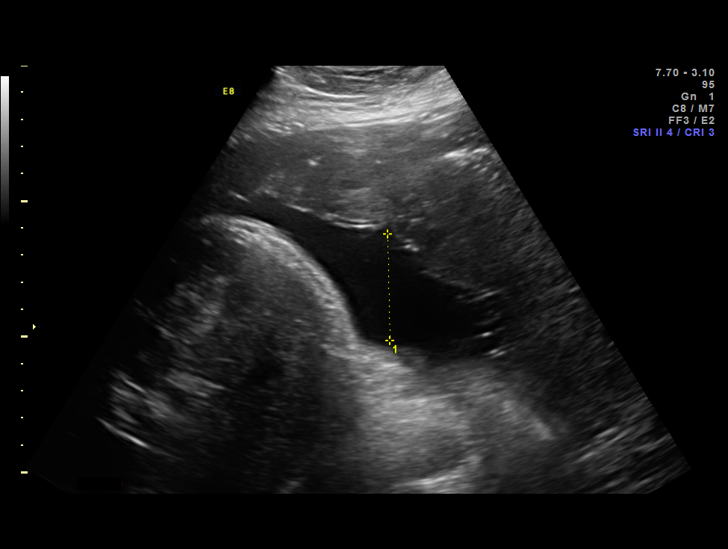
[im 40/42]
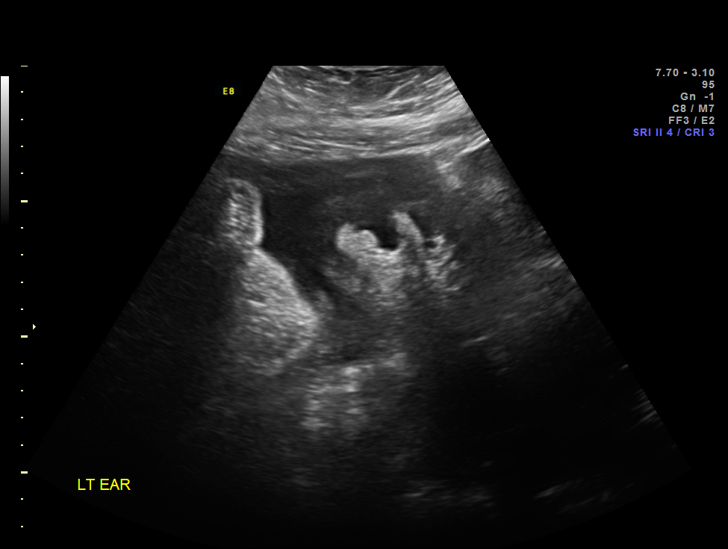

[12 of 28 positions shown; findings below may reference images not displayed]

OBSTETRICS REPORT
                      (Signed Final 11/20/2013 [DATE])

Service(s) Provided

 US OB FOLLOW UP                                       76816.1
Indications

 37 weeks gestation of pregnancy
 Obesity complicating pregnancy, third trimester
Fetal Evaluation

 Num Of Fetuses:    1
 Fetal Heart Rate:  136                          bpm
 Cardiac Activity:  Observed
 Presentation:      Cephalic
 Placenta:          Anterior, above cervical os
 P. Cord            Previously Visualized
 Insertion:

 Amniotic Fluid
 AFI FV:      Subjectively low-normal
 AFI Sum:     8.66    cm       15  %Tile     Larg Pckt:    5.28  cm
 RUQ:   0.81    cm   RLQ:    1.42   cm    LUQ:   1.15    cm   LLQ:    5.28   cm
Biometry

 BPD:     88.2  mm     G. Age:  35w 5d                CI:         74.5   70 - 86
 OFD:    118.4  mm                                    FL/HC:      23.4   20.9 -

 HC:     328.8  mm     G. Age:  37w 3d       20  %    HC/AC:      0.92   0.92 -

 AC:     356.6  mm     G. Age:  39w 4d       95  %    FL/BPD:     87.1   71 - 87
 FL:      76.8  mm     G. Age:  39w 2d       83  %    FL/AC:      21.5   20 - 24
 HUM:     66.2  mm     G. Age:  38w 3d       88  %

 Est. FW:    6716  gm    7 lb 14 oz      89  %
Gestational Age

 LMP:           37w 6d        Date:  02/28/13                 EDD:   12/05/13
 U/S Today:     38w 0d                                        EDD:   12/04/13
 Best:          37w 6d     Det. By:  LMP  (02/28/13)          EDD:   12/05/13
Anatomy

 Cranium:          Previously seen        Aortic Arch:      Previously seen
 Fetal Cavum:      Previously seen        Ductal Arch:      Previously seen
 Ventricles:       Previously seen        Diaphragm:        Appears normal
 Choroid Plexus:   Previously seen        Stomach:          Appears normal, left
                                                            sided
 Cerebellum:       Previously seen        Abdomen:          Previously seen
 Posterior Fossa:  Previously seen        Abdominal Wall:   Previously seen
 Nuchal Fold:      Previously seen        Cord Vessels:     Previously seen
 Face:             Orbits and profile     Kidneys:          Appear normal
                   previously seen
 Lips:             Previously seen        Bladder:          Appears normal
 Heart:            Previously seen        Spine:            Previously seen
 RVOT:             Previously seen        Lower             Previously seen
                                          Extremities:
 LVOT:             Previously seen        Upper             Previously seen
                                          Extremities:

 Other:  Female gender previously seen. Heels and 5th digit previously seen.
Cervix Uterus Adnexa

 Cervix:       Not visualized (advanced GA >98wks)
 Uterus:       No abnormality visualized.
 Cul De Sac:   No free fluid seen.
 Left Ovary:    No adnexal mass visualized.
 Right Ovary:   No adnexal mass visualized.

 Adnexa:     No adnexal mass visualized.
Impression

 SIUP at 37+6 weeks
 Low normal interval anatomy; anatomic survey complete
 Normal amniotic fluid volume
 Appropriate interval growth with EFW at the 89th %tile
Recommendations

 Follow-up as clinically indicated

## 2021-11-10 ENCOUNTER — Ambulatory Visit
Admission: RE | Admit: 2021-11-10 | Discharge: 2021-11-10 | Disposition: A | Discharge status: Home / Self Care | Payer: Managed Care, Other (non HMO) | Source: Ambulatory Visit | Attending: Family Medicine | Admitting: *Deleted

## 2021-11-10 ENCOUNTER — Other Ambulatory Visit: Payer: Self-pay | Admitting: Family Medicine

## 2021-11-10 ENCOUNTER — Ambulatory Visit
Admission: RE | Admit: 2021-11-10 | Discharge: 2021-11-10 | Disposition: A | Discharge status: Home / Self Care | Payer: Managed Care, Other (non HMO) | Attending: Family Medicine | Admitting: Family Medicine

## 2021-11-10 DIAGNOSIS — M545 Low back pain, unspecified: Secondary | ICD-10-CM

## 2022-04-02 ENCOUNTER — Ambulatory Visit
Admission: EM | Admit: 2022-04-02 | Discharge: 2022-04-02 | Disposition: A | Discharge status: Home / Self Care | Payer: Managed Care, Other (non HMO) | Attending: Emergency Medicine | Admitting: Emergency Medicine

## 2022-04-02 DIAGNOSIS — N898 Other specified noninflammatory disorders of vagina: Secondary | ICD-10-CM | POA: Diagnosis not present

## 2022-04-02 DIAGNOSIS — H1033 Unspecified acute conjunctivitis, bilateral: Secondary | ICD-10-CM | POA: Insufficient documentation

## 2022-04-02 LAB — WET PREP, GENITAL
Clue Cells Wet Prep HPF POC: NONE SEEN
Sperm: NONE SEEN
Trich, Wet Prep: NONE SEEN
WBC, Wet Prep HPF POC: <10 — AB (ref <10)
Yeast Wet Prep HPF POC: NONE SEEN

## 2022-04-02 MED ORDER — MOXIFLOXACIN HCL 0.5 % OP SOLN: 1.0000 [drp] | Freq: Three times a day (TID) | OPHTHALMIC | 0 refills | Status: AC

## 2022-04-02 NOTE — Discharge Instructions (Addendum)
Instill 1 drop of Vigamox in each eye every 8 hours for the next 7 days for treatment of your conjunctivitis.  Avoid touching your eyes as much as possible.  Wipe down all surfaces, countertops, and doorknobs after the first and second 24 hours on eyedrops.  Wash her face with a clean wash rag to remove any drainage and use a different portion of the wash rag to clean each eye so as to not reinfect yourself.  Your vaginal wet prep did not show any yeast, clue cells, or trichomonas.  Your cytology swab to look for gonorrhea and chlamydia will be back tomorrow.  If either results are positive you will receive a phone call but if both results are negative you will receive notification of new labs in your MyChart.  Return for reevaluation for any new or worsening symptoms.

## 2022-04-02 NOTE — ED Triage Notes (Signed)
Pt c/o bilateral eye itching and discharge x5days  Pt is having vaginal itching   Pt is worried about a yeast infection   Pt asks for STD check as well. She states that she checked her own urine and did a wet prep and did not see anything alarming.   Pt works with [facility] and wants labs sent to [facility].

## 2022-04-02 NOTE — ED Triage Notes (Signed)
Pt used a boric acid on 03/31/22

## 2022-04-02 NOTE — ED Provider Notes (Signed)
[facility]    CSN: [id number] Arrival date & time: 04/02/22  1450      History   Chief Complaint Chief Complaint  Patient presents with   Vaginal Itching   Eye Problem    HPI Leslie Navarro is a 37 y.o. female.   HPI  37 year old female here for evaluation of ocular and GYN complaints.  Patient reports that both of her symptoms have been going on for the past 5 days and they consist of vaginal itching that initially had a fishlike odor and itching to both eyes with a crusty discharge that is mostly there in the morning and improves throughout the day.  She denies any blurry vision or being exposed to any 1 else who has known pinkeye.  She denies any discharge, pain with urination, or urinary urgency or frequency.  Past Medical History:  Diagnosis Date   Medical history non-contributory     There are no problems to display for this patient.   Past Surgical History:  Procedure Laterality Date   CESAREAN SECTION     CESAREAN SECTION N/A 12/15/2013   Procedure: CESAREAN SECTION;  Surgeon: Mora Bellman, MD;  Location: [facility];  Service: Obstetrics;  Laterality: N/A;   LAPAROSCOPIC TUBAL LIGATION Bilateral 02/19/2014   Procedure: LAPAROSCOPIC TUBAL LIGATION;  Surgeon: Osborne Oman, MD;  Location: [facility];  Service: Gynecology;  Laterality: Bilateral;  Possible lysis of adhesions   TONSILLECTOMY      OB History     Gravida  2   Para  2   Term  2   Preterm  0   AB  0   Living  2      SAB  0   IAB  0   Ectopic  0   Multiple  0   Live Births  2            Home Medications    Prior to Admission medications   Medication Sig Start Date End Date Taking? Authorizing Provider  moxifloxacin (VIGAMOX) 0.5 % ophthalmic solution Place 1 drop into both eyes 3 (three) times daily for 7 days. 04/02/22 04/09/22 Yes Margarette Canada, NP    Family History Family History  Problem Relation Age of Onset   Diabetes Maternal Grandfather    Cancer  Paternal Grandfather 30       colon    Social History Social History   Tobacco Use   Smoking status: Every Day    Types: Cigars, Cigarettes   Smokeless tobacco: Never  Vaping Use   Vaping Use: Never used  Substance Use Topics   Alcohol use: Yes    Comment: occasionally drinks when not pregnant   Drug use: No     Allergies   Patient has no known allergies.   Review of Systems Review of Systems  Constitutional:  Negative for fever.  Eyes:  Positive for discharge, redness and itching. Negative for photophobia and visual disturbance.  Genitourinary:  Positive for vaginal pain. Negative for dysuria, frequency, hematuria, urgency and vaginal discharge.     Physical Exam Triage Vital Signs ED Triage Vitals  Enc Vitals Group     BP      Pulse      Resp      Temp      Temp src      SpO2      Weight      Height      Head Circumference      Peak  Flow      Pain Score      Pain Loc      Pain Edu?      Excl. in [place]?    No data found.  Updated Vital Signs BP 135/86 (BP Location: Left Arm)   Pulse 95   Temp 98.9 F (37.2 C) (Oral)   Resp 16   Ht '5\' 5"'$  (1.651 m)   Wt 240 lb (108.9 kg)   LMP 03/01/2022   SpO2 100%   BMI 39.94 kg/m   Visual Acuity Right Eye Distance:   Left Eye Distance:   Bilateral Distance:    Right Eye Near:   Left Eye Near:    Bilateral Near:     Physical Exam Vitals and nursing note reviewed.  Constitutional:      Appearance: Normal appearance. She is not ill-appearing.  Eyes:     Extraocular Movements: Extraocular movements intact.     Pupils: Pupils are equal, round, and reactive to light.     Comments: Bulbar conjunctiva are unremarkable.  The sclera is slightly muddy.  Labile conjunctiva are erythematous injected bilaterally.  No appreciable discharge.  Normal red light reflex in both eyes, pupils equal and reactive, EOMs intact.  Skin:    General: Skin is warm and dry.     Capillary Refill: Capillary refill takes less than 2  seconds.     Findings: No rash.  Neurological:     General: No focal deficit present.     Mental Status: She is alert and oriented to person, place, and time.  Psychiatric:        Mood and Affect: Mood normal.        Behavior: Behavior normal.        Thought Content: Thought content normal.        Judgment: Judgment normal.      UC Treatments / Results  Labs (all labs ordered are listed, but only abnormal results are displayed) Labs Reviewed  WET PREP, GENITAL - Abnormal; Notable for the following components:      Result Value   WBC, Wet Prep HPF POC <10 (*)    All other components within normal limits  CERVICOVAGINAL ANCILLARY ONLY    EKG   Radiology No results found.  Procedures Procedures (including critical care time)  Medications Ordered in UC Medications - No data to display  Initial Impression / Assessment and Plan / UC Course  I have reviewed the triage vital signs and the nursing notes.  Pertinent labs & imaging results that were available during my care of the patient were reviewed by me and considered in my medical decision making (see chart for details).   Is a pleasant, nontoxic-appearing 37 year old female here for evaluation of 2 separate complaints.  Her first complaint is bilateral eye itching and discharge x 5 days.  She states that she has noticed that her eyes are red and that they itch and they are crusted in the morning.  The crust improves after she takes a shower but she continues have itching throughout the day without any significant discharge.  She denies any changes in vision or contact with anyone with pinkeye.  On exam she does have injection of her Liverel conjunctiva bilaterally but her bulbar conjunctiva are unremarkable.  The patient does have allergies so it may very well be allergy irritation.  However, since her symptoms have been going on for the past 5 days and not showing any improvement I will cover her for  potential bacterial sources  with Vigamox 1 drop 3 times daily x 7 days.  Patient second complaint is that she has been experiencing vaginal itching for last 5 days.  She states that on the outside of her symptoms she did have a fishlike odor but she denies any discharge.  2 days ago she took a boric acid suppository and then performed a wet prep on herself this morning and states that she did not see any yeast or clue cells.  She denies any discharge and she denies any urinary symptoms.  She is requesting gonorrhea and Chlamydia testing.  She states that she recently had a full battery of STI tests that were all negative and she is still with the same partner.  I will order a wet prep and vaginal cytology swab.  Vaginal wet prep is negative for yeast, trichomonas, and clue cells.  I will discharge patient with a diagnosis of conjunctivitis and start her on Vigamox 1 drop 3 times daily x 7 days.  The source of her vaginal itching is unclear but her vaginal cytology swab is still pending.   Final Clinical Impressions(s) / UC Diagnoses   Final diagnoses:  Acute conjunctivitis of both eyes, unspecified acute conjunctivitis type  Vaginal itching     Discharge Instructions      Instill 1 drop of Vigamox in each eye every 8 hours for the next 7 days for treatment of your conjunctivitis.  Avoid touching your eyes as much as possible.  Wipe down all surfaces, countertops, and doorknobs after the first and second 24 hours on eyedrops.  Wash her face with a clean wash rag to remove any drainage and use a different portion of the wash rag to clean each eye so as to not reinfect yourself.  Your vaginal wet prep did not show any yeast, clue cells, or trichomonas.  Your cytology swab to look for gonorrhea and chlamydia will be back tomorrow.  If either results are positive you will receive a phone call but if both results are negative you will receive notification of new labs in your MyChart.  Return for reevaluation for any  new or worsening symptoms.      ED Prescriptions     Medication Sig Dispense Auth. Provider   moxifloxacin (VIGAMOX) 0.5 % ophthalmic solution Place 1 drop into both eyes 3 (three) times daily for 7 days. 3 mL Margarette Canada, NP      PDMP not reviewed this encounter.   Margarette Canada, NP 04/02/22 [phone]

## 2022-04-05 LAB — CERVICOVAGINAL ANCILLARY ONLY
Chlamydia: NEGATIVE
Comment: NEGATIVE
Comment: NEGATIVE
Comment: NORMAL
Neisseria Gonorrhea: NEGATIVE
Trichomonas: NEGATIVE

## 2023-05-20 ENCOUNTER — Encounter: Payer: Self-pay | Admitting: Family Medicine

## 2023-05-20 DIAGNOSIS — Z1231 Encounter for screening mammogram for malignant neoplasm of breast: Secondary | ICD-10-CM

## 2023-05-30 ENCOUNTER — Other Ambulatory Visit: Payer: Self-pay | Admitting: Family Medicine

## 2023-05-30 DIAGNOSIS — Z1231 Encounter for screening mammogram for malignant neoplasm of breast: Secondary | ICD-10-CM

## 2023-10-21 ENCOUNTER — Ambulatory Visit
Admission: RE | Admit: 2023-10-21 | Discharge: 2023-10-21 | Disposition: A | Discharge status: Home / Self Care | Source: Ambulatory Visit | Attending: Family Medicine | Admitting: Family Medicine

## 2023-10-21 DIAGNOSIS — Z1231 Encounter for screening mammogram for malignant neoplasm of breast: Secondary | ICD-10-CM | POA: Insufficient documentation
